# Patient Record
Sex: Female | Born: 1970
Health system: Southern US, Community
[De-identification: ages and names within clinical notes are randomized; demographics above are authoritative.]

## PROBLEM LIST (undated history)

## (undated) DIAGNOSIS — I499 Cardiac arrhythmia, unspecified: Secondary | ICD-10-CM

## (undated) DIAGNOSIS — F419 Anxiety disorder, unspecified: Secondary | ICD-10-CM

## (undated) DIAGNOSIS — R002 Palpitations: Secondary | ICD-10-CM

## (undated) DIAGNOSIS — Z87442 Personal history of urinary calculi: Secondary | ICD-10-CM

## (undated) DIAGNOSIS — R7303 Prediabetes: Secondary | ICD-10-CM

## (undated) DIAGNOSIS — G4730 Sleep apnea, unspecified: Secondary | ICD-10-CM

## (undated) DIAGNOSIS — K589 Irritable bowel syndrome without diarrhea: Secondary | ICD-10-CM

## (undated) DIAGNOSIS — R600 Localized edema: Secondary | ICD-10-CM

## (undated) DIAGNOSIS — M549 Dorsalgia, unspecified: Secondary | ICD-10-CM

## (undated) DIAGNOSIS — I10 Essential (primary) hypertension: Secondary | ICD-10-CM

## (undated) DIAGNOSIS — R0602 Shortness of breath: Secondary | ICD-10-CM

## (undated) DIAGNOSIS — K219 Gastro-esophageal reflux disease without esophagitis: Secondary | ICD-10-CM

## (undated) DIAGNOSIS — F32A Depression, unspecified: Secondary | ICD-10-CM

## (undated) HISTORY — DX: Dorsalgia, unspecified: M54.9

## (undated) HISTORY — DX: Essential (primary) hypertension: I10

## (undated) HISTORY — DX: Depression, unspecified: F32.A

## (undated) HISTORY — DX: Anxiety disorder, unspecified: F41.9

## (undated) HISTORY — PX: NO PAST SURGERIES: SHX2092

## (undated) HISTORY — DX: Sleep apnea, unspecified: G47.30

## (undated) HISTORY — DX: Shortness of breath: R06.02

## (undated) HISTORY — DX: Localized edema: R60.0

## (undated) HISTORY — DX: Gastro-esophageal reflux disease without esophagitis: K21.9

## (undated) HISTORY — DX: Palpitations: R00.2

## (undated) HISTORY — DX: Irritable bowel syndrome, unspecified: K58.9

---

## 2003-07-22 ENCOUNTER — Ambulatory Visit (HOSPITAL_COMMUNITY): Admission: RE | Admit: 2003-07-22 | Discharge: 2003-07-22 | Payer: Self-pay | Admitting: Family Medicine

## 2005-06-18 ENCOUNTER — Ambulatory Visit (HOSPITAL_COMMUNITY): Admission: RE | Admit: 2005-06-18 | Discharge: 2005-06-18 | Payer: Self-pay | Admitting: Family Medicine

## 2012-04-29 ENCOUNTER — Other Ambulatory Visit: Payer: Self-pay | Admitting: Family Medicine

## 2012-04-29 DIAGNOSIS — R109 Unspecified abdominal pain: Secondary | ICD-10-CM

## 2012-04-30 ENCOUNTER — Other Ambulatory Visit: Payer: Self-pay | Admitting: Family Medicine

## 2012-04-30 ENCOUNTER — Ambulatory Visit (HOSPITAL_COMMUNITY)
Admission: RE | Admit: 2012-04-30 | Discharge: 2012-04-30 | Disposition: A | Discharge status: Home / Self Care | Payer: BC Managed Care – PPO | Source: Ambulatory Visit | Attending: Family Medicine | Admitting: Family Medicine

## 2012-04-30 DIAGNOSIS — R109 Unspecified abdominal pain: Secondary | ICD-10-CM

## 2013-01-05 ENCOUNTER — Other Ambulatory Visit: Payer: Self-pay | Admitting: Nurse Practitioner

## 2013-01-22 ENCOUNTER — Ambulatory Visit (INDEPENDENT_AMBULATORY_CARE_PROVIDER_SITE_OTHER): Payer: BC Managed Care – PPO | Admitting: Family Medicine

## 2013-01-22 ENCOUNTER — Encounter: Payer: Self-pay | Admitting: Family Medicine

## 2013-01-22 VITALS — BP 110/80 | Temp 98.9°F | Wt 172.0 lb

## 2013-01-22 DIAGNOSIS — J029 Acute pharyngitis, unspecified: Secondary | ICD-10-CM

## 2013-01-22 MED ORDER — AZITHROMYCIN 250 MG PO TABS: ORAL_TABLET | ORAL | Status: DC

## 2013-01-22 NOTE — Progress Notes (Signed)
  Subjective:    Patient ID: Jillian Ford, female    DOB: [birth year], 42 y.o.   MRN: [id number]  Otalgia  There is pain in the right ear. The current episode started in the past 7 days. Associated symptoms include a sore throat. Associated symptoms comments: Runny nose.      Review of Systems  HENT: Positive for ear pain and sore throat.        Objective:   Physical Exam Lungs are clear heart is regular throat erythematous the eardrums are normal neck no masses       Assessment & Plan:  Otalgia-referred pain from the throat Pharyngitis-Z-Pak as directed followup if ongoing troubles

## 2013-01-23 ENCOUNTER — Encounter: Payer: Self-pay | Admitting: *Deleted

## 2013-03-05 ENCOUNTER — Encounter: Payer: Self-pay | Admitting: Nurse Practitioner

## 2013-03-05 ENCOUNTER — Other Ambulatory Visit: Payer: Self-pay | Admitting: Nurse Practitioner

## 2013-03-05 ENCOUNTER — Ambulatory Visit (INDEPENDENT_AMBULATORY_CARE_PROVIDER_SITE_OTHER): Payer: BC Managed Care – PPO | Admitting: Nurse Practitioner

## 2013-03-05 VITALS — BP 130/90 | Ht 64.0 in | Wt 176.0 lb

## 2013-03-05 DIAGNOSIS — B373 Candidiasis of vulva and vagina: Secondary | ICD-10-CM

## 2013-03-05 DIAGNOSIS — K2970 Gastritis, unspecified, without bleeding: Secondary | ICD-10-CM | POA: Insufficient documentation

## 2013-03-05 DIAGNOSIS — Z0000 Encounter for general adult medical examination without abnormal findings: Secondary | ICD-10-CM

## 2013-03-05 DIAGNOSIS — Z01419 Encounter for gynecological examination (general) (routine) without abnormal findings: Secondary | ICD-10-CM

## 2013-03-05 LAB — POCT WET PREP WITH KOH
KOH Prep POC: NEGATIVE
RBC Wet Prep HPF POC: NEGATIVE
Trichomonas, UA: NEGATIVE
Yeast Wet Prep HPF POC: POSITIVE
pH: 4.5

## 2013-03-05 MED ORDER — PANTOPRAZOLE SODIUM 40 MG PO TBEC: 40.0000 mg | DELAYED_RELEASE_TABLET | Freq: Every day | ORAL | Status: DC

## 2013-03-05 MED ORDER — FLUCONAZOLE 150 MG PO TABS: ORAL_TABLET | ORAL | Status: DC

## 2013-03-05 MED ORDER — ESCITALOPRAM OXALATE 10 MG PO TABS: 10.0000 mg | ORAL_TABLET | Freq: Every day | ORAL | Status: DC

## 2013-03-05 NOTE — Progress Notes (Signed)
Subjective:    Patient ID: Jillian Ford, female    DOB: [birth year], 42 y.o.   MRN: [id number]  HPI Presents for wellness checkup. Same sexual partner. Regular cycles, normal flow. Slight vaginal itching and burning x 5 days. Slight discharge. Rare abdominal pain. Now on Zantac, not taking qd. Needs dental exam, has a broken tooth. Some early morning awakenings.  Some anxiety that she relates to work. Regular eye exams. Drinks caffeine, nonsmoker, rare NSAID and occasional social ETOH.   Review of Systems  Constitutional: Positive for appetite change and fatigue. Negative for fever and activity change.  HENT: Positive for dental problem. Negative for hearing loss, ear pain, congestion, sore throat and rhinorrhea.   Eyes: Negative for visual disturbance.  Respiratory: Negative for cough, chest tightness, shortness of breath and wheezing.   Cardiovascular: Negative for chest pain, palpitations and leg swelling.  Gastrointestinal: Positive for abdominal pain. Negative for nausea, vomiting, diarrhea, constipation, anal bleeding and rectal pain.  Genitourinary: Positive for vaginal discharge. Negative for dysuria, urgency, frequency, enuresis, difficulty urinating, genital sores, menstrual problem, pelvic pain and dyspareunia.  Neurological: Negative for headaches.  Psychiatric/Behavioral: Positive for sleep disturbance. Negative for dysphoric mood. The patient is nervous/anxious.        Objective:   Physical Exam  Vitals reviewed. Constitutional: She is oriented to person, place, and time. She appears well-developed. No distress.  HENT:  Right Ear: External ear normal.  Left Ear: External ear normal.  Mouth/Throat: Oropharynx is clear and moist.  Neck: Normal range of motion. Neck supple. No tracheal deviation present. No thyromegaly present.  Cardiovascular: Normal rate, regular rhythm and normal heart sounds.  Exam reveals no gallop.   No murmur heard. Pulmonary/Chest: Effort normal and  breath sounds normal.  Abdominal: Soft. She exhibits no distension. There is no tenderness.  Genitourinary: Vagina normal and uterus normal. No vaginal discharge found.  Musculoskeletal: She exhibits no edema.  Lymphadenopathy:    She has no cervical adenopathy.  Neurological: She is alert and oriented to person, place, and time.  Skin: Skin is warm and dry. No rash noted.  Psychiatric: She has a normal mood and affect. Her behavior is normal.  Breast exam: No dominant masses; axillae no adenopathy EGBUS normal; vagina moderate amount white mucoid discharge Wet prep: ph 4.5, positive WBC Correction to above: mild epigastric tenderness       Assessment & Plan:  Well woman exam  Routine general medical examination at a health care facility - Plan: MM Digital Screening  Gastritis  Vaginal candidiasis - Plan: POCT Wet Prep with KOH  Meds ordered this encounter  Medications  . fluticasone (FLONASE) 50 MCG/ACT nasal spray    Sig:   . DISCONTD: pantoprazole (PROTONIX) 40 MG tablet    Sig:   . sucralfate (CARAFATE) 1 G tablet    Sig:   . pantoprazole (PROTONIX) 40 MG tablet    Sig: Take 1 tablet (40 mg total) by mouth daily. Prn acid reflux    Dispense:  30 tablet    Refill:  5    Order Specific Question:  Supervising Provider    Answer:  Merlyn Albert [[id number]]  . fluconazole (DIFLUCAN) 150 MG tablet    Sig: One po qd prn yeast infection; may repeat in 3-4 days if needed    Dispense:  2 tablet    Refill:  0    Order Specific Question:  Supervising Provider    Answer:  Merlyn Albert [[id number]]  Hold on  Zantac. Switch to Protonix.  Slowly wean off caffeine. Defers GI referral at this time. Recommend dental and vision exams. Next physical in one year.

## 2013-03-05 NOTE — Patient Instructions (Signed)
Gastroesophageal Reflux Disease, Adult  Gastroesophageal reflux disease (GERD) happens when acid from your stomach flows up into the esophagus. When acid comes in contact with the esophagus, the acid causes soreness (inflammation) in the esophagus. Over time, GERD may create small holes (ulcers) in the lining of the esophagus.  CAUSES   · Increased body weight. This puts pressure on the stomach, making acid rise from the stomach into the esophagus.  · Smoking. This increases acid production in the stomach.  · Drinking alcohol. This causes decreased pressure in the lower esophageal sphincter (valve or ring of muscle between the esophagus and stomach), allowing acid from the stomach into the esophagus.  · Late evening meals and a full stomach. This increases pressure and acid production in the stomach.  · A malformed lower esophageal sphincter.  Sometimes, no cause is found.  SYMPTOMS   · Burning pain in the lower part of the mid-chest behind the breastbone and in the mid-stomach area. This may occur twice a week or more often.  · Trouble swallowing.  · Sore throat.  · Dry cough.  · Asthma-like symptoms including chest tightness, shortness of breath, or wheezing.  DIAGNOSIS   Your caregiver may be able to diagnose GERD based on your symptoms. In some cases, X-rays and other tests may be done to check for complications or to check the condition of your stomach and esophagus.  TREATMENT   Your caregiver may recommend over-the-counter or prescription medicines to help decrease acid production. Ask your caregiver before starting or adding any new medicines.   HOME CARE INSTRUCTIONS   · Change the factors that you can control. Ask your caregiver for guidance concerning weight loss, quitting smoking, and alcohol consumption.  · Avoid foods and drinks that make your symptoms worse, such as:  · Caffeine or alcoholic drinks.  · Chocolate.  · Peppermint or mint flavorings.  · Garlic and onions.  · Spicy foods.  · Citrus fruits,  such as oranges, lemons, or limes.  · Tomato-based foods such as sauce, chili, salsa, and pizza.  · Fried and fatty foods.  · Avoid lying down for the 3 hours prior to your bedtime or prior to taking a nap.  · Eat small, frequent meals instead of large meals.  · Wear loose-fitting clothing. Do not wear anything tight around your waist that causes pressure on your stomach.  · Raise the head of your bed 6 to 8 inches with wood blocks to help you sleep. Extra pillows will not help.  · Only take over-the-counter or prescription medicines for pain, discomfort, or fever as directed by your caregiver.  · Do not take aspirin, ibuprofen, or other nonsteroidal anti-inflammatory drugs (NSAIDs).  SEEK IMMEDIATE MEDICAL CARE IF:   · You have pain in your arms, neck, jaw, teeth, or back.  · Your pain increases or changes in intensity or duration.  · You develop nausea, vomiting, or sweating (diaphoresis).  · You develop shortness of breath, or you faint.  · Your vomit is green, yellow, black, or looks like coffee grounds or blood.  · Your stool is red, bloody, or black.  These symptoms could be signs of other problems, such as heart disease, gastric bleeding, or esophageal bleeding.  MAKE SURE YOU:   · Understand these instructions.  · Will watch your condition.  · Will get help right away if you are not doing well or get worse.  Document Released: 04/11/2005 Document Revised: 09/24/2011 Document Reviewed: 01/19/2011  [employer]® Patient   Information ©2014 [employer].

## 2013-03-05 NOTE — Assessment & Plan Note (Signed)
Hold on Zantac. Switch to Protonix.  Slowly wean off caffeine. Defers GI referral at this time. Recommend dental and vision exams. Next physical in one year.

## 2013-03-09 ENCOUNTER — Ambulatory Visit (HOSPITAL_COMMUNITY)
Admission: RE | Admit: 2013-03-09 | Discharge: 2013-03-09 | Disposition: A | Discharge status: Home / Self Care | Payer: BC Managed Care – PPO | Source: Ambulatory Visit | Attending: Nurse Practitioner | Admitting: Nurse Practitioner

## 2013-03-09 DIAGNOSIS — Z1231 Encounter for screening mammogram for malignant neoplasm of breast: Secondary | ICD-10-CM | POA: Insufficient documentation

## 2013-03-12 ENCOUNTER — Other Ambulatory Visit: Payer: Self-pay | Admitting: Nurse Practitioner

## 2013-03-12 DIAGNOSIS — R928 Other abnormal and inconclusive findings on diagnostic imaging of breast: Secondary | ICD-10-CM

## 2013-04-01 ENCOUNTER — Other Ambulatory Visit: Payer: Self-pay | Admitting: Nurse Practitioner

## 2013-04-01 ENCOUNTER — Ambulatory Visit (HOSPITAL_COMMUNITY)
Admission: RE | Admit: 2013-04-01 | Discharge: 2013-04-01 | Disposition: A | Discharge status: Home / Self Care | Payer: BC Managed Care – PPO | Source: Ambulatory Visit | Attending: Nurse Practitioner | Admitting: Nurse Practitioner

## 2013-04-01 DIAGNOSIS — R928 Other abnormal and inconclusive findings on diagnostic imaging of breast: Secondary | ICD-10-CM

## 2013-04-01 MED ORDER — ESCITALOPRAM OXALATE 10 MG PO TABS: 10.0000 mg | ORAL_TABLET | Freq: Every day | ORAL | Status: DC

## 2013-04-30 ENCOUNTER — Other Ambulatory Visit: Payer: Self-pay | Admitting: *Deleted

## 2013-04-30 MED ORDER — LEVONORGESTREL-ETHINYL ESTRAD 0.1-20 MG-MCG PO TABS: 1.0000 | ORAL_TABLET | Freq: Every day | ORAL | Status: DC

## 2013-05-04 ENCOUNTER — Other Ambulatory Visit: Payer: Self-pay | Admitting: *Deleted

## 2013-05-04 MED ORDER — LEVONORGESTREL-ETHINYL ESTRAD 0.1-20 MG-MCG PO TABS: 1.0000 | ORAL_TABLET | Freq: Every day | ORAL | Status: DC

## 2013-05-21 ENCOUNTER — Other Ambulatory Visit: Payer: Self-pay

## 2013-05-28 ENCOUNTER — Encounter: Payer: Self-pay | Admitting: Family Medicine

## 2013-05-28 ENCOUNTER — Ambulatory Visit (INDEPENDENT_AMBULATORY_CARE_PROVIDER_SITE_OTHER): Payer: BC Managed Care – PPO | Admitting: Family Medicine

## 2013-05-28 VITALS — BP 132/88 | Temp 98.7°F | Ht 64.0 in | Wt 178.0 lb

## 2013-05-28 DIAGNOSIS — J019 Acute sinusitis, unspecified: Secondary | ICD-10-CM

## 2013-05-28 MED ORDER — AZITHROMYCIN 250 MG PO TABS: ORAL_TABLET | ORAL | Status: DC

## 2013-05-28 MED ORDER — HYDROCODONE-HOMATROPINE 5-1.5 MG/5ML PO SYRP: ORAL_SOLUTION | ORAL | Status: DC

## 2013-05-28 NOTE — Progress Notes (Signed)
  Subjective:    Patient ID: Jillian Ford, female    DOB: Jan 17, 1971, 42 y.o.   MRN: [id number]  Cough This is a new problem. The current episode started in the past 7 days. Associated symptoms include chest pain, headaches, nasal congestion and postnasal drip. Associated symptoms comments: sneezing. Treatments tried: mucinex D. The treatment provided mild relief.   Patient doesn't smoke PMH benign   Review of Systems  HENT: Positive for postnasal drip.   Respiratory: Positive for cough.   Cardiovascular: Positive for chest pain.  Neurological: Positive for headaches.       Objective:   Physical Exam  Lungs clear heart trigger pulse normal extremities no edema skin warm dry      Assessment & Plan:  Mild breast revealed as well as sinusitis antibiotics prescribed warning signs discussed followup ongoing trouble warnings were discussed

## 2013-07-17 ENCOUNTER — Encounter: Payer: Self-pay | Admitting: Family Medicine

## 2013-07-18 ENCOUNTER — Other Ambulatory Visit: Payer: Self-pay | Admitting: Family Medicine

## 2013-07-18 MED ORDER — ACYCLOVIR 400 MG PO TABS: 400.0000 mg | ORAL_TABLET | Freq: Two times a day (BID) | ORAL | Status: DC

## 2013-07-21 ENCOUNTER — Encounter: Payer: Self-pay | Admitting: Family Medicine

## 2013-07-21 ENCOUNTER — Ambulatory Visit (INDEPENDENT_AMBULATORY_CARE_PROVIDER_SITE_OTHER): Payer: BC Managed Care – PPO | Admitting: Family Medicine

## 2013-07-21 VITALS — BP 120/78 | Temp 99.0°F | Ht 64.0 in | Wt 180.8 lb

## 2013-07-21 DIAGNOSIS — R21 Rash and other nonspecific skin eruption: Secondary | ICD-10-CM

## 2013-07-21 DIAGNOSIS — J029 Acute pharyngitis, unspecified: Secondary | ICD-10-CM

## 2013-07-21 NOTE — Progress Notes (Signed)
   Subjective:    Patient ID: Jillian Ford, female    DOB: 1971/06/22, 43 y.o.   MRN: [id number]  HPI Patient arrives with a sore throat and white sore patches in mouth and on tongue since this am. Results for orders placed in visit on 03/05/13  POCT WET PREP WITH KOH      Result Value Range   Trichomonas, UA Negative     Clue Cells Wet Prep HPF POC rare     Epithelial Wet Prep HPF POC occas     Yeast Wet Prep HPF POC pos     Bacteria Wet Prep HPF POC rare     RBC Wet Prep HPF POC neg     WBC Wet Prep HPF POC multiple     KOH Prep POC Negative     pH 4.5     Results for orders placed in visit on 03/05/13  POCT WET PREP WITH KOH      Result Value Range   Trichomonas, UA Negative     Clue Cells Wet Prep HPF POC rare     Epithelial Wet Prep HPF POC occas     Yeast Wet Prep HPF POC pos     Bacteria Wet Prep HPF POC rare     RBC Wet Prep HPF POC neg     WBC Wet Prep HPF POC multiple     KOH Prep POC Negative     pH 4.5     Rapid strep screen negative. Recent cold sore. See telephone message. We called in Zovirax. Patient compliant with Very sore throat Had a bit of cough that lingers a month or more  Review of Systems No headache no chest pain slight cough intermittent rare no vomiting no diarrhea ROS otherwise negative    Objective:   Physical Exam  Alert slight malaise. Temp 99 hydration good HET moderate his congestion. Pharynx slight erythema neck supple tender anterior nodes herpes eruption on lip. Lungs clear heart rare rhythm.      Assessment & Plan:  Impression viral syndrome. #2 cold sore. #3 waning cough. Plan symptomatic care only. No further antibiotics. Expect gradual resolution. WSL

## 2013-07-22 LAB — STREP A DNA PROBE: GASP: NEGATIVE

## 2013-11-19 ENCOUNTER — Other Ambulatory Visit: Payer: Self-pay | Admitting: Family Medicine

## 2014-05-06 ENCOUNTER — Other Ambulatory Visit: Payer: Self-pay | Admitting: Family Medicine

## 2014-05-10 ENCOUNTER — Other Ambulatory Visit: Payer: Self-pay | Admitting: Nurse Practitioner

## 2014-08-11 ENCOUNTER — Ambulatory Visit (INDEPENDENT_AMBULATORY_CARE_PROVIDER_SITE_OTHER): Payer: BLUE CROSS/BLUE SHIELD | Admitting: Nurse Practitioner

## 2014-08-11 ENCOUNTER — Encounter: Payer: Self-pay | Admitting: Nurse Practitioner

## 2014-08-11 VITALS — BP 122/82 | Ht 64.0 in | Wt 185.6 lb

## 2014-08-11 DIAGNOSIS — Z0000 Encounter for general adult medical examination without abnormal findings: Secondary | ICD-10-CM

## 2014-08-11 DIAGNOSIS — Z01419 Encounter for gynecological examination (general) (routine) without abnormal findings: Secondary | ICD-10-CM

## 2014-08-11 DIAGNOSIS — K2970 Gastritis, unspecified, without bleeding: Secondary | ICD-10-CM

## 2014-08-11 DIAGNOSIS — Z1231 Encounter for screening mammogram for malignant neoplasm of breast: Secondary | ICD-10-CM

## 2014-08-11 DIAGNOSIS — Z124 Encounter for screening for malignant neoplasm of cervix: Secondary | ICD-10-CM

## 2014-08-11 DIAGNOSIS — R0683 Snoring: Secondary | ICD-10-CM

## 2014-08-11 DIAGNOSIS — R5382 Chronic fatigue, unspecified: Secondary | ICD-10-CM

## 2014-08-11 LAB — BASIC METABOLIC PANEL
BUN: 9 mg/dL (ref 6–23)
CO2: 25 mEq/L (ref 19–32)
Calcium: 8.9 mg/dL (ref 8.4–10.5)
Chloride: 105 mEq/L (ref 96–112)
Creat: 0.68 mg/dL (ref 0.50–1.10)
Glucose, Bld: 79 mg/dL (ref 70–99)
POTASSIUM: 4 meq/L (ref 3.5–5.3)
SODIUM: 139 meq/L (ref 135–145)

## 2014-08-11 LAB — HEPATIC FUNCTION PANEL
ALT: 24 U/L (ref 0–35)
AST: 20 U/L (ref 0–37)
Albumin: 4.1 g/dL (ref 3.5–5.2)
Alkaline Phosphatase: 71 U/L (ref 39–117)
Bilirubin, Direct: 0.2 mg/dL (ref 0.0–0.3)
Indirect Bilirubin: 0.4 mg/dL (ref 0.2–1.2)
Total Bilirubin: 0.6 mg/dL (ref 0.2–1.2)
Total Protein: 6.9 g/dL (ref 6.0–8.3)

## 2014-08-11 LAB — LIPID PANEL
Cholesterol: 111 mg/dL (ref 0–200)
HDL: 72 mg/dL (ref >39)
LDL Cholesterol: 31 mg/dL (ref 0–99)
Total CHOL/HDL Ratio: 1.5 Ratio
Triglycerides: 38 mg/dL (ref <150)
VLDL: 8 mg/dL (ref 0–40)

## 2014-08-11 LAB — TSH: TSH: 1.547 u[IU]/mL (ref 0.350–4.500)

## 2014-08-11 MED ORDER — FLUTICASONE PROPIONATE 50 MCG/ACT NA SUSP: NASAL | Status: DC

## 2014-08-11 MED ORDER — BUPROPION HCL ER (XL) 150 MG PO TB24: 150.0000 mg | ORAL_TABLET | Freq: Every day | ORAL | Status: DC

## 2014-08-11 MED ORDER — LEVONORGESTREL-ETHINYL ESTRAD 0.1-20 MG-MCG PO TABS: 1.0000 | ORAL_TABLET | Freq: Every day | ORAL | Status: DC

## 2014-08-11 MED ORDER — PANTOPRAZOLE SODIUM 40 MG PO TBEC: 40.0000 mg | DELAYED_RELEASE_TABLET | Freq: Every day | ORAL | Status: DC

## 2014-08-11 NOTE — Progress Notes (Signed)
Subjective:    Patient ID: Jillian Ford, female    DOB: 12/24/1970, 44 y.o.   MRN: [id number]  HPI presents for her wellness exam. Regular cycles; light flow. Married, same sexual partner. Regular vision and dental exams. Reflux stable, takes reflux meds on prn basis. No regular exercise. Snoring at night time. Concerned about sleep apnea.    Review of Systems  Constitutional: Positive for fatigue. Negative for fever, activity change and appetite change.  HENT: Negative for dental problem, ear pain, sinus pressure, sore throat and trouble swallowing.   Respiratory: Negative for cough, choking, chest tightness, shortness of breath and wheezing.   Cardiovascular: Negative for chest pain.  Gastrointestinal: Positive for constipation. Negative for nausea, vomiting, abdominal pain, diarrhea, blood in stool and abdominal distention.  Genitourinary: Negative for dysuria, urgency, frequency, vaginal discharge, enuresis, difficulty urinating, genital sores, menstrual problem and pelvic pain.  Psychiatric/Behavioral: Positive for sleep disturbance.       Continued depression symptoms. Could not take Lexapro due to side effects.        Objective:   Physical Exam  Constitutional: She is oriented to person, place, and time. She appears well-developed. No distress.  HENT:  Right Ear: External ear normal.  Left Ear: External ear normal.  Mouth/Throat: Oropharynx is clear and moist.  Neck: Normal range of motion. Neck supple. No tracheal deviation present. No thyromegaly present.  Cardiovascular: Normal rate, regular rhythm and normal heart sounds.  Exam reveals no gallop.   No murmur heard. Pulmonary/Chest: Effort normal and breath sounds normal.  Abdominal: Soft. She exhibits no distension and no mass. There is tenderness. There is no rebound and no guarding.  Mild epigastric area tenderness  Genitourinary: Vagina normal and uterus normal. No vaginal discharge found.  External GU: no rashes or  lesions. Vagina: no discharge. Cervix normal in appearance; no CMT. Bimanual exam: no tenderness or obvious masses.  Musculoskeletal: She exhibits no edema.  Lymphadenopathy:    She has no cervical adenopathy.  Neurological: She is alert and oriented to person, place, and time.  Skin: Skin is warm and dry. No rash noted.  Psychiatric: She has a normal mood and affect. Her behavior is normal.  Vitals reviewed. Breast exam: slightly dense tissue; no masses; axillae no adenopathy.        Assessment & Plan:   Problem List Items Addressed This Visit      Digestive   Gastritis    Other Visit Diagnoses    Well woman exam    -  Primary    Relevant Orders    Pap IG w/ reflex to HPV when ASC-U    Lipid panel    Hepatic function panel    Basic metabolic panel    Screening for cervical cancer        Relevant Orders    Pap IG w/ reflex to HPV when ASC-U    Chronic fatigue        Relevant Orders    TSH    Vit D  25 hydroxy (rtn osteoporosis monitoring)    Visit for screening mammogram        Relevant Orders    MM DIGITAL SCREENING BILATERAL    Snoring          Patient to fill out [place] questionnaire and bring to next visit. Restart Protonix daily and call back in 2-3 weeks if no better. Stop Lexapro. Switch to Wellbutrin. DC med and call if any problems. Encouraged healthy diet, regular activity  and weight loss. Discussed potential risks associated with oc use.  Meds ordered this encounter  Medications  . levonorgestrel-ethinyl estradiol (AVIANE) 0.1-20 MG-MCG tablet    Sig: Take 1 tablet by mouth daily.    Dispense:  28 tablet    Refill:  11    Order Specific Question:  Supervising Provider    Answer:  Merlyn Albert [[id number]]  . fluticasone (FLONASE) 50 MCG/ACT nasal spray    Sig: 2 sprays each nostril daily prn congestion    Dispense:  16 g    Refill:  5    Order Specific Question:  Supervising Provider    Answer:  Merlyn Albert [[id number]]  . pantoprazole (PROTONIX) 40  MG tablet    Sig: Take 1 tablet (40 mg total) by mouth daily. Prn acid reflux    Dispense:  30 tablet    Refill:  5    Order Specific Question:  Supervising Provider    Answer:  Merlyn Albert [[id number]]  . buPROPion (WELLBUTRIN XL) 150 MG 24 hr tablet    Sig: Take 1 tablet (150 mg total) by mouth daily.    Dispense:  30 tablet    Refill:  2    Order Specific Question:  Supervising Provider    Answer:  Merlyn Albert [[id number]]  . acyclovir (ZOVIRAX) 400 MG tablet    Sig:     Refill:  1   Return in about 1 month (around 09/11/2014) for med recheck. Next PE in one year.

## 2014-08-12 ENCOUNTER — Encounter: Payer: Self-pay | Admitting: Nurse Practitioner

## 2014-08-12 LAB — PAP IG W/ RFLX HPV ASCU

## 2014-08-12 LAB — VITAMIN D 25 HYDROXY (VIT D DEFICIENCY, FRACTURES): VIT D 25 HYDROXY: 35 ng/mL (ref 30–100)

## 2014-08-16 NOTE — Progress Notes (Signed)
Card sent 

## 2014-08-19 ENCOUNTER — Ambulatory Visit (HOSPITAL_COMMUNITY)
Admission: RE | Admit: 2014-08-19 | Discharge: 2014-08-19 | Disposition: A | Discharge status: Home / Self Care | Payer: BLUE CROSS/BLUE SHIELD | Source: Ambulatory Visit | Attending: Nurse Practitioner | Admitting: Nurse Practitioner

## 2014-08-19 DIAGNOSIS — Z1231 Encounter for screening mammogram for malignant neoplasm of breast: Secondary | ICD-10-CM | POA: Insufficient documentation

## 2014-08-23 ENCOUNTER — Ambulatory Visit (INDEPENDENT_AMBULATORY_CARE_PROVIDER_SITE_OTHER): Payer: BLUE CROSS/BLUE SHIELD | Admitting: Nurse Practitioner

## 2014-08-23 ENCOUNTER — Encounter: Payer: Self-pay | Admitting: Nurse Practitioner

## 2014-08-23 VITALS — BP 130/86 | Temp 99.2°F | Ht 64.0 in | Wt 187.0 lb

## 2014-08-23 DIAGNOSIS — T7840XA Allergy, unspecified, initial encounter: Secondary | ICD-10-CM

## 2014-08-23 DIAGNOSIS — Z889 Allergy status to unspecified drugs, medicaments and biological substances status: Secondary | ICD-10-CM

## 2014-08-23 MED ORDER — PREDNISONE 20 MG PO TABS: ORAL_TABLET | ORAL | Status: DC

## 2014-08-23 NOTE — Patient Instructions (Signed)
Loratadine 10 mg in the morning Benadryl 25 mg at night 

## 2014-08-23 NOTE — Progress Notes (Signed)
Subjective:  Presents for c/o possible allergic reaction to wellbutrin. Started new med on 2/4 and began having rash and itching about 48 hours later. Took a total of 3 doses. Pharmacist advised her to stop med until she could consult with us. No fever. No wheezing or difficulty swallowing. No known allergens.  Objective:   BP 130/86 mmHg  Temp(Src) 99.2 F (37.3 C) (Oral)  Ht 5\' 4"  (1.626 m)  Wt 187 lb (84.823 kg)  BMI 32.08 kg/m2  LMP 08/04/2014 NAD. Alert, oriented. Lungs clear. Heart RRR. Diffuse dark pink fine maculopapular rash over body especially on abdomen.   Assessment: Allergic reaction caused by a drug  Plan:  Meds ordered this encounter  Medications  . predniSONE (DELTASONE) 20 MG tablet    Sig: 3 po qd x 3 d then 2 po qd x 3 d then 1 po qd x 3 d    Dispense:  18 tablet    Refill:  0    Order Specific Question:  Supervising Provider    Answer:  Merlyn AlbertLUKING, WILLIAM S [[id number]]   Stop Wellbutrin. OTC antihistamines as directed. Call back in 72 hours if no improvement, sooner if worse. Reviewed warning signs.

## 2014-09-10 ENCOUNTER — Encounter: Payer: Self-pay | Admitting: Nurse Practitioner

## 2014-09-10 ENCOUNTER — Ambulatory Visit (INDEPENDENT_AMBULATORY_CARE_PROVIDER_SITE_OTHER): Payer: BLUE CROSS/BLUE SHIELD | Admitting: Nurse Practitioner

## 2014-09-10 ENCOUNTER — Ambulatory Visit (HOSPITAL_COMMUNITY)
Admission: RE | Admit: 2014-09-10 | Discharge: 2014-09-10 | Disposition: A | Discharge status: Home / Self Care | Payer: BLUE CROSS/BLUE SHIELD | Source: Ambulatory Visit | Attending: Nurse Practitioner | Admitting: Nurse Practitioner

## 2014-09-10 VITALS — BP 132/86 | Ht 64.0 in | Wt 187.2 lb

## 2014-09-10 DIAGNOSIS — R0683 Snoring: Secondary | ICD-10-CM | POA: Diagnosis not present

## 2014-09-10 DIAGNOSIS — R5383 Other fatigue: Secondary | ICD-10-CM | POA: Diagnosis not present

## 2014-09-10 DIAGNOSIS — M25561 Pain in right knee: Secondary | ICD-10-CM | POA: Insufficient documentation

## 2014-09-10 DIAGNOSIS — M65852 Other synovitis and tenosynovitis, left thigh: Secondary | ICD-10-CM | POA: Diagnosis not present

## 2014-09-10 DIAGNOSIS — M76892 Other specified enthesopathies of left lower limb, excluding foot: Secondary | ICD-10-CM

## 2014-09-10 MED ORDER — NABUMETONE 750 MG PO TABS: 750.0000 mg | ORAL_TABLET | Freq: Every day | ORAL | Status: DC

## 2014-09-14 ENCOUNTER — Encounter: Payer: Self-pay | Admitting: Nurse Practitioner

## 2014-09-14 NOTE — Progress Notes (Signed)
Subjective:  Presents to review her questionnaire to assess for OSA. Her [place] questionnaire shows very high scores indicating need for further testing. Also complaints of right knee pain for the past several months. No specific history of injury. After this began patient has also had a flareup of left hip tenderness which she had several years ago. No back pain.  Objective:   BP 132/86 mmHg  Ht 5\' 4"  (1.626 m)  Wt 187 lb 3.2 oz (84.913 kg)  BMI 32.12 kg/m2  LMP 08/04/2014 NAD. Alert, oriented. Lungs clear. Heart regular rate rhythm. Good passive ROM of the left hip without tenderness. Tenderness with palpation of the left hip joint. Both knees have significant crepitus but much worse in the right knee. No joint laxity. Mild tenderness to palpation of the anterior joint area.  Assessment: Snoring  Other fatigue  Right knee pain - Plan: DG Knee Complete 4 Views Right  Tendinitis of left hip  Plan:  Meds ordered this encounter  Medications  . nabumetone (RELAFEN) 750 MG tablet    Sig: Take 1 tablet (750 mg total) by mouth daily. Prn pain    Dispense:  30 tablet    Refill:  0    Order Specific Question:  Supervising Provider    Answer:  Merlyn AlbertLUKING, WILLIAM S [[id number]]   Schedule home-based sleep study if insurance will cover this. X-ray pending. Based on history, feel that right knee pain caused change in her gait which flared up the tendinitis in her left hip. Relafen as directed. Ice/heat applications. Light knee brace or Ace wrap. Call back if symptoms worsen or persist.

## 2014-10-05 ENCOUNTER — Other Ambulatory Visit (HOSPITAL_COMMUNITY): Payer: Self-pay | Admitting: Radiology

## 2014-10-05 DIAGNOSIS — R0683 Snoring: Secondary | ICD-10-CM

## 2014-10-05 DIAGNOSIS — G4730 Sleep apnea, unspecified: Secondary | ICD-10-CM

## 2014-10-19 ENCOUNTER — Other Ambulatory Visit: Payer: Self-pay | Admitting: Nurse Practitioner

## 2014-10-19 NOTE — Telephone Encounter (Signed)
Last seen 2.26.16.

## 2014-10-24 ENCOUNTER — Ambulatory Visit: Payer: BLUE CROSS/BLUE SHIELD | Attending: Family Medicine | Admitting: Sleep Medicine

## 2014-10-24 DIAGNOSIS — G4730 Sleep apnea, unspecified: Secondary | ICD-10-CM | POA: Insufficient documentation

## 2014-10-24 DIAGNOSIS — R0683 Snoring: Secondary | ICD-10-CM

## 2014-10-30 NOTE — Sleep Study (Signed)
  [facility] Jillian Tramble A. Gerilyn Pilgrimoonquah, MD     [email or web address]        NOCTURNAL POLYSOMNOGRAM    LOCATION: SLEEP LAB FACILITY: [facility]   PHYSICIAN: Zynasia Burklow A. Gerilyn Ford, M.D.   DATE OF STUDY: 10/24/2014.   REFERRING PHYSICIAN: Lilyan PuntScott Luking.   INDICATIONS: The patient is a 44 year old female who presents with hypersomnia, fatigue and snoring.  MEDICATIONS:  Prior to Admission medications   Medication Sig Start Date End Date Taking? Authorizing Provider  acyclovir (ZOVIRAX) 400 MG tablet  05/24/14   Historical Provider, MD  fluticasone (FLONASE) 50 MCG/ACT nasal spray 2 sprays each nostril daily prn congestion 08/11/14   Campbell Richesarolyn C Hoskins, NP  levonorgestrel-ethinyl estradiol (AVIANE) 0.1-20 MG-MCG tablet Take 1 tablet by mouth daily. 08/11/14   Campbell Richesarolyn C Hoskins, NP  nabumetone (RELAFEN) 750 MG tablet TAKE ONE TABLET BY MOUTH ONCE DAILY AS NEEDED FOR PAIN 10/19/14   Babs SciaraScott A Luking, MD  pantoprazole (PROTONIX) 40 MG tablet Take 1 tablet (40 mg total) by mouth daily. Prn acid reflux 08/11/14   Campbell Richesarolyn C Hoskins, NP  Ranitidine HCl (ZANTAC PO) Take by mouth.    Historical Provider, MD      EPWORTH SLEEPINESS SCALE: 13.   BMI: 32.   ARCHITECTURAL SUMMARY: Total recording time was 412 minutes. Sleep efficiency 80 %. Sleep latency 56 minutes. REM latency 77 minutes. Stage NI 6 %, N2 66 % and N3 7 % and REM sleep 21 %.    RESPIRATORY DATA:  Baseline oxygen saturation is 97 %. The lowest saturation is 82 %. The diagnostic AHI is 15. The RDI is 16. The REM AHI is 29.  LIMB MOVEMENT SUMMARY: PLM index 2.   ELECTROCARDIOGRAM SUMMARY: Average heart rate is 80 with no significant dysrhythmias observed.   IMPRESSION:  1. Moderate obstructive sleep apnea syndrome worse during REM sleep. Formal CPAP titration study is recommended.  Thanks for this referral.  Arch Methot A. Gerilyn Ford, M.D. Diplomat, Biomedical engineerAmerican Board of Sleep Medicine.

## 2014-11-01 ENCOUNTER — Encounter: Payer: Self-pay | Admitting: Nurse Practitioner

## 2014-11-02 ENCOUNTER — Other Ambulatory Visit: Payer: Self-pay | Admitting: Nurse Practitioner

## 2014-11-02 MED ORDER — ESCITALOPRAM OXALATE 10 MG PO TABS: 10.0000 mg | ORAL_TABLET | Freq: Every day | ORAL | Status: DC

## 2014-11-24 ENCOUNTER — Other Ambulatory Visit: Payer: Self-pay | Admitting: *Deleted

## 2014-11-24 DIAGNOSIS — G4730 Sleep apnea, unspecified: Secondary | ICD-10-CM

## 2014-12-20 ENCOUNTER — Encounter: Payer: Self-pay | Admitting: Nurse Practitioner

## 2014-12-20 ENCOUNTER — Ambulatory Visit (INDEPENDENT_AMBULATORY_CARE_PROVIDER_SITE_OTHER): Payer: BLUE CROSS/BLUE SHIELD | Admitting: Nurse Practitioner

## 2014-12-20 ENCOUNTER — Other Ambulatory Visit (HOSPITAL_COMMUNITY): Payer: Self-pay | Admitting: Respiratory Therapy

## 2014-12-20 VITALS — BP 142/96 | Temp 98.7°F | Wt 179.0 lb

## 2014-12-20 DIAGNOSIS — Z23 Encounter for immunization: Secondary | ICD-10-CM

## 2014-12-20 DIAGNOSIS — W57XXXA Bitten or stung by nonvenomous insect and other nonvenomous arthropods, initial encounter: Secondary | ICD-10-CM | POA: Diagnosis not present

## 2014-12-20 DIAGNOSIS — S40862A Insect bite (nonvenomous) of left upper arm, initial encounter: Secondary | ICD-10-CM | POA: Diagnosis not present

## 2014-12-20 DIAGNOSIS — G4730 Sleep apnea, unspecified: Secondary | ICD-10-CM

## 2014-12-20 MED ORDER — DOXYCYCLINE HYCLATE 100 MG PO TABS: 100.0000 mg | ORAL_TABLET | Freq: Two times a day (BID) | ORAL | Status: DC

## 2014-12-20 NOTE — Addendum Note (Signed)
Addended by: Metro KungICHARDS, Jurney Overacker M on: 12/20/2014 04:00 PM   Modules accepted: Orders

## 2014-12-20 NOTE — Progress Notes (Signed)
Subjective:  Presents for complete plaints of a possible bite on the left upper outer arm. First noticed 2 days ago. Had a blister on the area which ruptured this morning. No fever. No joint pain. No rash.  Objective:   BP 142/96 mmHg  Temp(Src) 98.7 F (37.1 C) (Oral)  Wt 179 lb (81.194 kg) NAD. Alert, oriented. Slightly raised pink area noted on the left upper lateral arm. In the center is a 3-4 mm superficial open area with no drainage. Minimally tender to palpation.  Assessment: Insect bite of left arm, initial encounter; suspect probable spider bite  Plan:  Meds ordered this encounter  Medications  . doxycycline (VIBRA-TABS) 100 MG tablet    Sig: Take 1 tablet (100 mg total) by mouth 2 (two) times daily.    Dispense:  14 tablet    Refill:  0    Order Specific Question:  Supervising Provider    Answer:  Merlyn AlbertLUKING, WILLIAM S [[id number]]   Reviewed warning signs including signs of infection and signs of brown recluse bite. Start doxycycline if needed. Warm compresses. Call back by the end of the week if no improvement, sooner if worse. Also recommend tetanus up-to-date either in our office or at the pharmacy.

## 2014-12-31 ENCOUNTER — Other Ambulatory Visit (HOSPITAL_COMMUNITY): Payer: Self-pay | Admitting: Respiratory Therapy

## 2015-01-03 ENCOUNTER — Ambulatory Visit: Payer: BLUE CROSS/BLUE SHIELD | Attending: Family Medicine | Admitting: Sleep Medicine

## 2015-01-03 DIAGNOSIS — G4730 Sleep apnea, unspecified: Secondary | ICD-10-CM | POA: Insufficient documentation

## 2015-01-08 NOTE — Sleep Study (Signed)
  [facility] Anie Juniel A. Gerilyn Pilgrim, MD     [email or web address]        NOCTURNAL POLYSOMNOGRAM    LOCATION: SLEEP LAB FACILITY: [place]   PHYSICIAN: Liberti Appleton A. Gerilyn Pilgrim, M.D.   DATE OF STUDY: 01/03/2015.   REFERRING PHYSICIAN: Lilyan Punt.   INDICATIONS: Patient is a 44 year old who had a previous study documenting significant obstructive sleep apnea syndrome. This is a CPAP titration recording.  MEDICATIONS:  Prior to Admission medications   Medication Sig Start Date End Date Taking? Authorizing Provider  acyclovir (ZOVIRAX) 400 MG tablet  05/24/14   Historical Provider, MD  doxycycline (VIBRA-TABS) 100 MG tablet Take 1 tablet (100 mg total) by mouth 2 (two) times daily. 12/20/14   Campbell Riches, NP  escitalopram (LEXAPRO) 10 MG tablet Take 1 tablet (10 mg total) by mouth daily. 11/02/14 11/02/15  Campbell Riches, NP  fluticasone Aleda Grana) 50 MCG/ACT nasal spray 2 sprays each nostril daily prn congestion 08/11/14   Campbell Riches, NP  levonorgestrel-ethinyl estradiol (AVIANE) 0.1-20 MG-MCG tablet Take 1 tablet by mouth daily. 08/11/14   Campbell Riches, NP  nabumetone (RELAFEN) 750 MG tablet TAKE ONE TABLET BY MOUTH ONCE DAILY AS NEEDED FOR PAIN 10/19/14   Babs Sciara, MD  pantoprazole (PROTONIX) 40 MG tablet Take 1 tablet (40 mg total) by mouth daily. Prn acid reflux 08/11/14   Campbell Riches, NP  Ranitidine HCl (ZANTAC PO) Take by mouth.    Historical Provider, MD      EPWORTH SLEEPINESS SCALE: 15.   BMI: 32.   ARCHITECTURAL SUMMARY: Total recording time was 436 minutes. Sleep efficiency 87 %. Sleep latency 18 minutes. REM latency 74 minutes. Stage NI 3 %, N2 53 % and N3 14 % and REM sleep 30 %.    RESPIRATORY DATA:  Baseline oxygen saturation is 98 %. The lowest saturation is 90 %. The patient was placed on positive pressure starting at 5 and increased to 8. The optimal pressure is 8 with resolution of obstructive events and good tolerance.  LIMB MOVEMENT  SUMMARY: PLM index 0.   ELECTROCARDIOGRAM SUMMARY: Average heart rate is 72 with no significant dysrhythmias observed.   IMPRESSION:  1. Obstructive sleep apnea syndrome which responds well to a CPAP of 8.  Thanks for this referral.  Jamaurion Slemmer A. Gerilyn Pilgrim, M.D. Diplomat, Biomedical engineer of Sleep Medicine.

## 2015-02-28 ENCOUNTER — Telehealth: Payer: Self-pay | Admitting: Family Medicine

## 2015-02-28 NOTE — Telephone Encounter (Signed)
Pt is wanting to know/discuss her sleep study results.

## 2015-02-28 NOTE — Telephone Encounter (Signed)
For some reason the test result was posted in the electronic records but this was not automatically brought to the attention of Jillian Ford. The tests does show sleep apnea. It does show that she does best with CPAP of 8 cm. We would recommend starting CPAP at 8 cm. The patient may get this at [facility] if the patient would like to set up office visit to discuss in detail we will be happy to do so. Otherwise start CPAP follow-up in approximately 3-4 weeks with Jillian Ford. May check with her insurance company to see if the insurance company has preferred provider.

## 2015-03-01 NOTE — Telephone Encounter (Signed)
[facility] (script for Cpap and office note faxed to [facility])

## 2015-03-01 NOTE — Telephone Encounter (Signed)
Notified patient that the tests does show sleep apnea. It does show that she does best with CPAP of 8 cm. We would recommend starting CPAP at 8 cm. The patient may get this at [facility] if the patient would like to set up office visit to discuss in detail we will be happy to do so. Otherwise start CPAP follow-up in approximately 3-4 weeks with Eber Jones. May check with her insurance company to see if the insurance company has preferred provider. Patient verbalized understanding. Order faxed to [facility].

## 2015-03-09 ENCOUNTER — Telehealth: Payer: Self-pay | Admitting: Family Medicine

## 2015-03-09 NOTE — Telephone Encounter (Signed)
Pt states the first night she used cpap she had really bad gas, the second night she woke up with bad stomach pains. She has not used since then and has not had any issues with her stomach. Please advise

## 2015-03-09 NOTE — Telephone Encounter (Signed)
Pt called stating that she needs to speak to a nurse regarding her cpap machine. Pt states that she is so bloated in the mornings after using it that she is miserable.

## 2015-03-10 NOTE — Telephone Encounter (Signed)
Please find out from the patient where she got her CPAP machine. Possibly the settings can be adjusted to gradually increase the pressure up to the stated amount. If we do that correction and she continues to have ongoing issues the next step would be we would refer her to a pulmonary sleep specialist who may try other measures. The first thing we would need to know his where she got her machine so we can talk with the supplier

## 2015-03-10 NOTE — Telephone Encounter (Signed)
Spoke with patient and asked her per Dr.Scott who supplier was for the CPAP machine. Patient stated that it was [place] and she also stated that she wore CPAP for 6 hours last night and had fewer symptoms. I called [employer] and asked if the pressure could be adjusted up to the stated amount per Dr.Scott and was informed that we would need a new prescription to adjust the pressure and an appointment would need to be made for the patient to see the respiratory therapist.

## 2015-03-11 NOTE — Telephone Encounter (Signed)
Prescription faxed over to [facility]. Called patient and informed her that prescription was faxed and she should be able to call and make an appointment with the respiratory therapist at [facility] to have CPAP adjusted to the correct amount. Patient verbalized understanding.

## 2015-03-11 NOTE — Telephone Encounter (Signed)
Please write out on a prescription for the patient to have CPAP machine with gradual titration to her stated amount. I will sign it then fax it to [facility] and they can schedule her an appointment with respiratory therapy.

## 2015-06-03 ENCOUNTER — Other Ambulatory Visit: Payer: Self-pay | Admitting: Family Medicine

## 2015-06-03 ENCOUNTER — Telehealth: Payer: Self-pay | Admitting: Family Medicine

## 2015-06-03 NOTE — Telephone Encounter (Signed)
Called patient and informed her per Dr.Scott Luking that refill was sent in for Acyclovir. Patient verbalized understanding.

## 2015-06-03 NOTE — Telephone Encounter (Signed)
May ref prn 

## 2015-06-03 NOTE — Telephone Encounter (Signed)
Pt is needing a refill on her acyclovir sent into [facility] [place].

## 2015-07-27 ENCOUNTER — Other Ambulatory Visit: Payer: Self-pay | Admitting: Family Medicine

## 2015-08-02 ENCOUNTER — Other Ambulatory Visit: Payer: Self-pay | Admitting: *Deleted

## 2015-08-02 MED ORDER — LEVONORGESTREL-ETHINYL ESTRAD 0.1-20 MG-MCG PO TABS: 1.0000 | ORAL_TABLET | Freq: Every day | ORAL | Status: DC

## 2015-08-02 MED ORDER — ACYCLOVIR 400 MG PO TABS: 400.0000 mg | ORAL_TABLET | Freq: Two times a day (BID) | ORAL | Status: DC

## 2015-08-02 MED ORDER — FLUTICASONE PROPIONATE 50 MCG/ACT NA SUSP: NASAL | Status: DC

## 2015-08-26 ENCOUNTER — Ambulatory Visit (INDEPENDENT_AMBULATORY_CARE_PROVIDER_SITE_OTHER): Payer: BLUE CROSS/BLUE SHIELD | Admitting: Nurse Practitioner

## 2015-08-26 ENCOUNTER — Encounter: Payer: Self-pay | Admitting: Nurse Practitioner

## 2015-08-26 VITALS — BP 132/88 | Ht 64.0 in | Wt 189.0 lb

## 2015-08-26 DIAGNOSIS — F419 Anxiety disorder, unspecified: Secondary | ICD-10-CM | POA: Diagnosis not present

## 2015-08-26 DIAGNOSIS — G4700 Insomnia, unspecified: Secondary | ICD-10-CM

## 2015-08-26 DIAGNOSIS — F411 Generalized anxiety disorder: Secondary | ICD-10-CM

## 2015-08-26 DIAGNOSIS — F430 Acute stress reaction: Principal | ICD-10-CM

## 2015-08-26 MED ORDER — CLONAZEPAM 0.5 MG PO TABS: 0.5000 mg | ORAL_TABLET | Freq: Two times a day (BID) | ORAL | Status: DC | PRN

## 2015-08-26 MED ORDER — CITALOPRAM HYDROBROMIDE 20 MG PO TABS: 20.0000 mg | ORAL_TABLET | Freq: Every day | ORAL | Status: DC

## 2015-08-29 ENCOUNTER — Encounter: Payer: Self-pay | Admitting: Nurse Practitioner

## 2015-08-29 DIAGNOSIS — G4700 Insomnia, unspecified: Secondary | ICD-10-CM | POA: Insufficient documentation

## 2015-08-29 DIAGNOSIS — F419 Anxiety disorder, unspecified: Secondary | ICD-10-CM | POA: Insufficient documentation

## 2015-08-29 NOTE — Progress Notes (Signed)
Subjective:  Presents for recheck on her anxiety. Stopped her Lexapro due to side effects. Has been under extreme stress lately. Weight gain. Early morning awakenings. States her mind is racing at times. Emotional lability. Difficulty focusing. Difficulty remembering short-term. Denies suicidal or homicidal thoughts or ideation.  Objective:   BP 132/88 mmHg  Ht  (1.626 m)  Wt 189 lb (85.73 kg)  BMI 32.43 kg/m2 NAD. Alert, oriented. Thoughts logical coherent and relevant. Mildly anxious affect. Tearful during visit. Lungs clear. Heart regular rate rhythm.  Assessment:  Problem List Items Addressed This Visit      Other   Anxiety as acute reaction to exceptional stress - Primary   Relevant Medications   citalopram (CELEXA) 20 MG tablet   Insomnia     Plan:  Meds ordered this encounter  Medications  . citalopram (CELEXA) 20 MG tablet    Sig: Take 1 tablet (20 mg total) by mouth daily.    Dispense:  30 tablet    Refill:  2    Order Specific Question:  Supervising Provider    Answer:  Merlyn Albert [[id number]]  . clonazePAM (KLONOPIN) 0.5 MG tablet    Sig: Take 1 tablet (0.5 mg total) by mouth 2 (two) times daily as needed for anxiety.    Dispense:  30 tablet    Refill:  2    Order Specific Question:  Supervising Provider    Answer:  Riccardo Dubin   Start daily citalopram as directed. DC med and call if any adverse effects. Also Klonopin mainly for nighttime, may take occasionally during the day only for extreme anxiety. Return in about 3 months (around 11/23/2015) for recheck. Call back sooner if needed.

## 2015-09-22 ENCOUNTER — Other Ambulatory Visit: Payer: Self-pay | Admitting: Family Medicine

## 2015-10-19 ENCOUNTER — Other Ambulatory Visit: Payer: Self-pay | Admitting: Nurse Practitioner

## 2015-11-24 ENCOUNTER — Ambulatory Visit: Payer: BLUE CROSS/BLUE SHIELD | Admitting: Nurse Practitioner

## 2015-12-08 ENCOUNTER — Other Ambulatory Visit: Payer: Self-pay | Admitting: Family Medicine

## 2015-12-13 ENCOUNTER — Encounter: Payer: Self-pay | Admitting: Family Medicine

## 2015-12-13 ENCOUNTER — Ambulatory Visit (INDEPENDENT_AMBULATORY_CARE_PROVIDER_SITE_OTHER): Payer: BLUE CROSS/BLUE SHIELD | Admitting: Family Medicine

## 2015-12-13 VITALS — BP 134/80 | Temp 98.9°F | Ht 64.0 in | Wt 188.2 lb

## 2015-12-13 DIAGNOSIS — T148 Other injury of unspecified body region: Secondary | ICD-10-CM | POA: Diagnosis not present

## 2015-12-13 DIAGNOSIS — W57XXXA Bitten or stung by nonvenomous insect and other nonvenomous arthropods, initial encounter: Secondary | ICD-10-CM | POA: Diagnosis not present

## 2015-12-13 MED ORDER — DOXYCYCLINE HYCLATE 100 MG PO CAPS: 100.0000 mg | ORAL_CAPSULE | Freq: Two times a day (BID) | ORAL | Status: DC

## 2015-12-13 NOTE — Progress Notes (Signed)
   Subjective:    Patient ID: Jillian Ford, female    DOB: Dec 15, 1970, 45 y.o.   MRN: [id number]  HPI Patient is here today for a insect bite on her back. Redness, itchiness and drainage. Onset 2 days ago. Treatments tried: none.   Patient states that she has no other concerns at this time.  Patient states she had a bug bite it's progressively got more red slightly tender denies high fever chills denies sweats nausea or vomiting. Does not know if it was a tick bite or another insect. She noticed this a couple days ago it's been getting progressively worse denies severe headaches  Review of Systems     Objective:   Physical Exam  Lungs clear hearts regular the right posterior aspect has a reddened area approximately one half to 2 inches in length and 1 inch in diameter appears to be a localized allergic reaction with possibility cellulitis. There does appear to be a central area where a bug bite occurred.      Assessment & Plan:  Allergic reaction with possible cellulitis antibiotics prescribed hydrocortisone cream for itching follow-up if progressive troubles warning signs were discussed in detail

## 2015-12-17 ENCOUNTER — Other Ambulatory Visit: Payer: Self-pay | Admitting: Family Medicine

## 2015-12-19 ENCOUNTER — Telehealth: Payer: Self-pay | Admitting: Family Medicine

## 2015-12-19 NOTE — Telephone Encounter (Signed)
Rx sent electronically to pharmacy. Patient notified. 

## 2015-12-19 NOTE — Telephone Encounter (Signed)
FALMINA 0.1-20 MG-MCG tablet  Refill please    [facility]

## 2016-02-08 ENCOUNTER — Encounter: Payer: Self-pay | Admitting: Nurse Practitioner

## 2016-02-09 ENCOUNTER — Other Ambulatory Visit: Payer: Self-pay | Admitting: Nurse Practitioner

## 2016-02-09 MED ORDER — DULOXETINE HCL 30 MG PO CPEP: 30.0000 mg | ORAL_CAPSULE | Freq: Every day | ORAL | 2 refills | Status: DC

## 2016-02-23 IMAGING — MG MM DIGITAL SCREENING
4 series · 4 of 4 positions shown · non-contrast
Comparison: Previous exam(s).

CLINICAL DATA: Screening.

EXAM:
DIGITAL SCREENING BILATERAL MAMMOGRAM WITH CAD

[L CC]
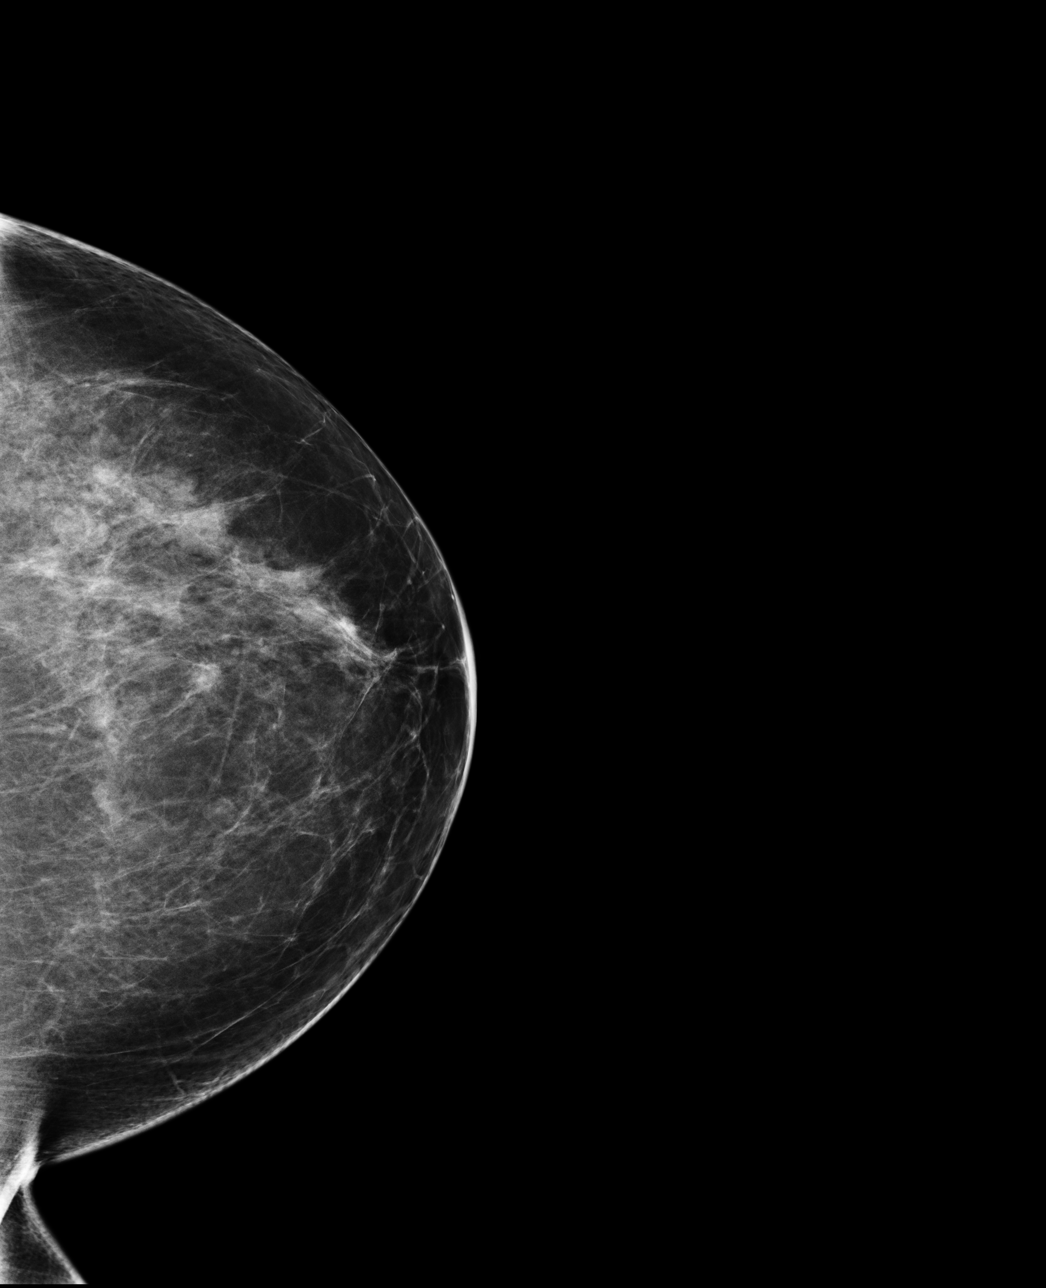

[L MLO]
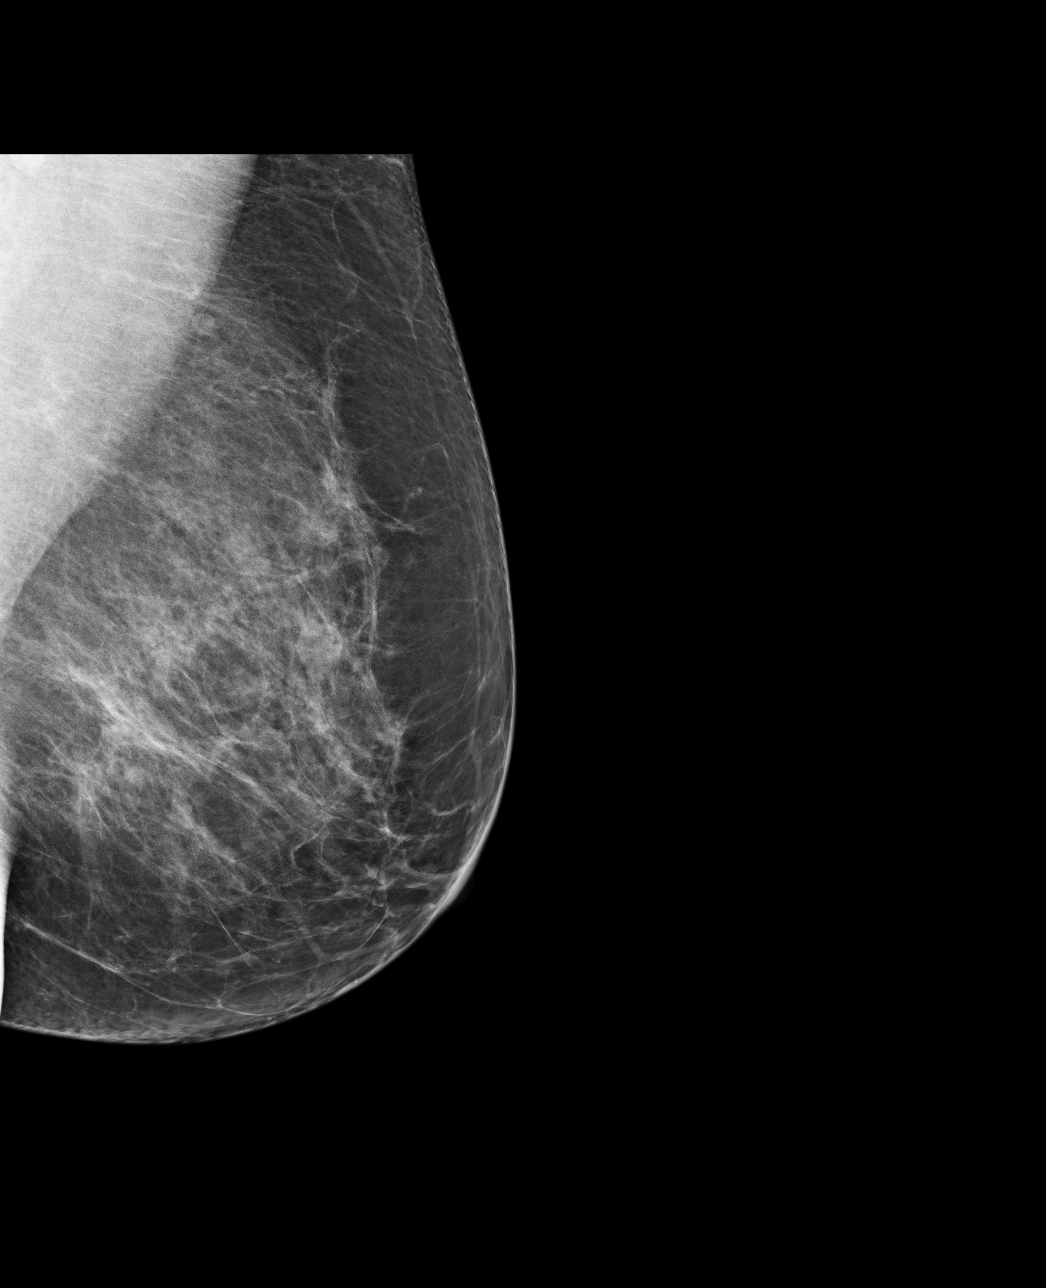

[R CC]
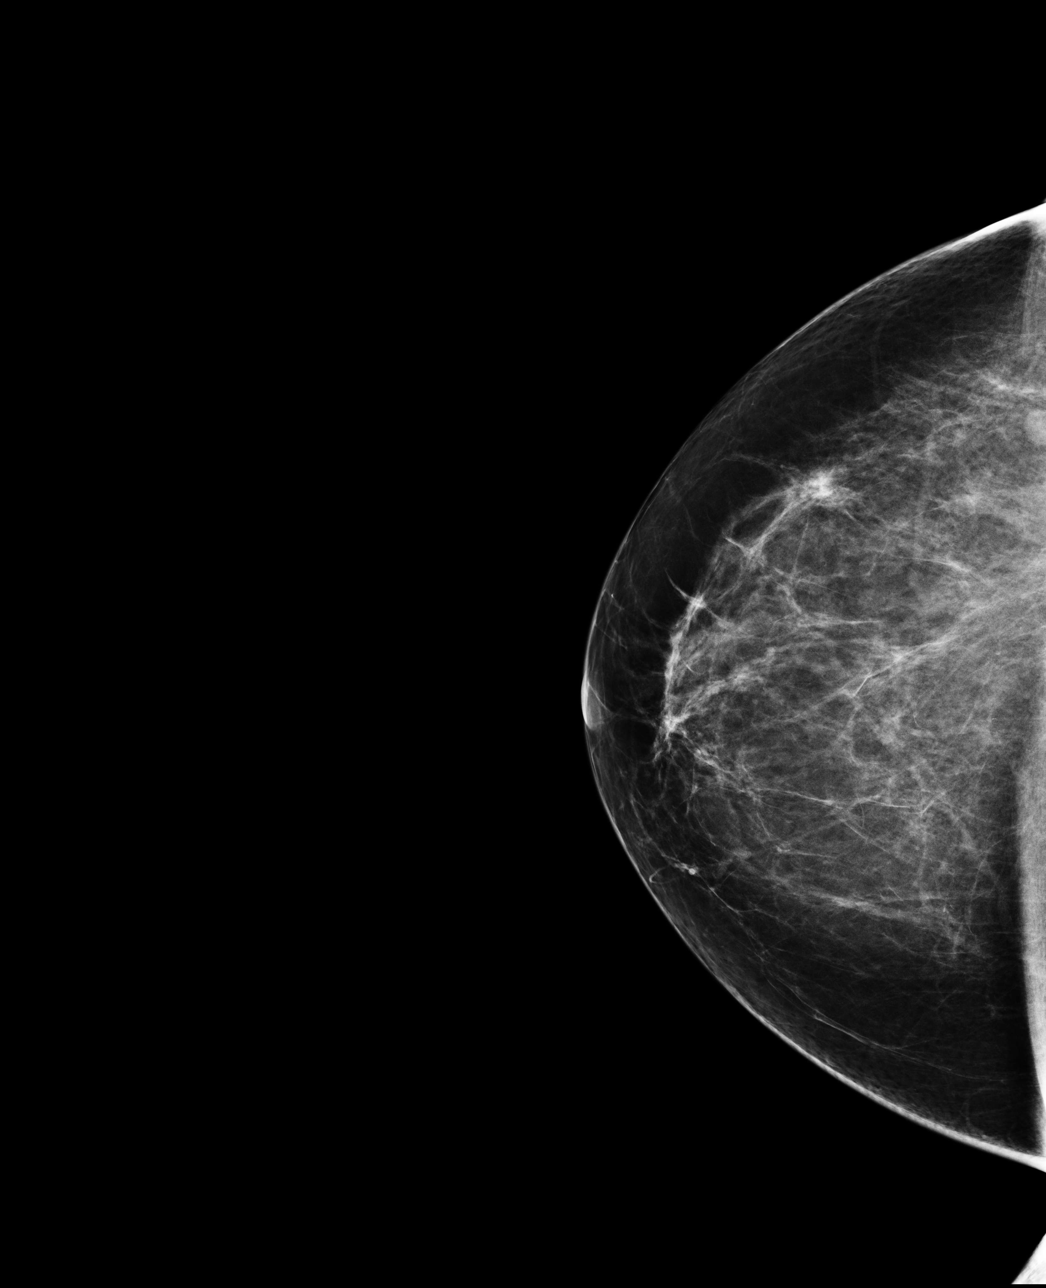

[R MLO]
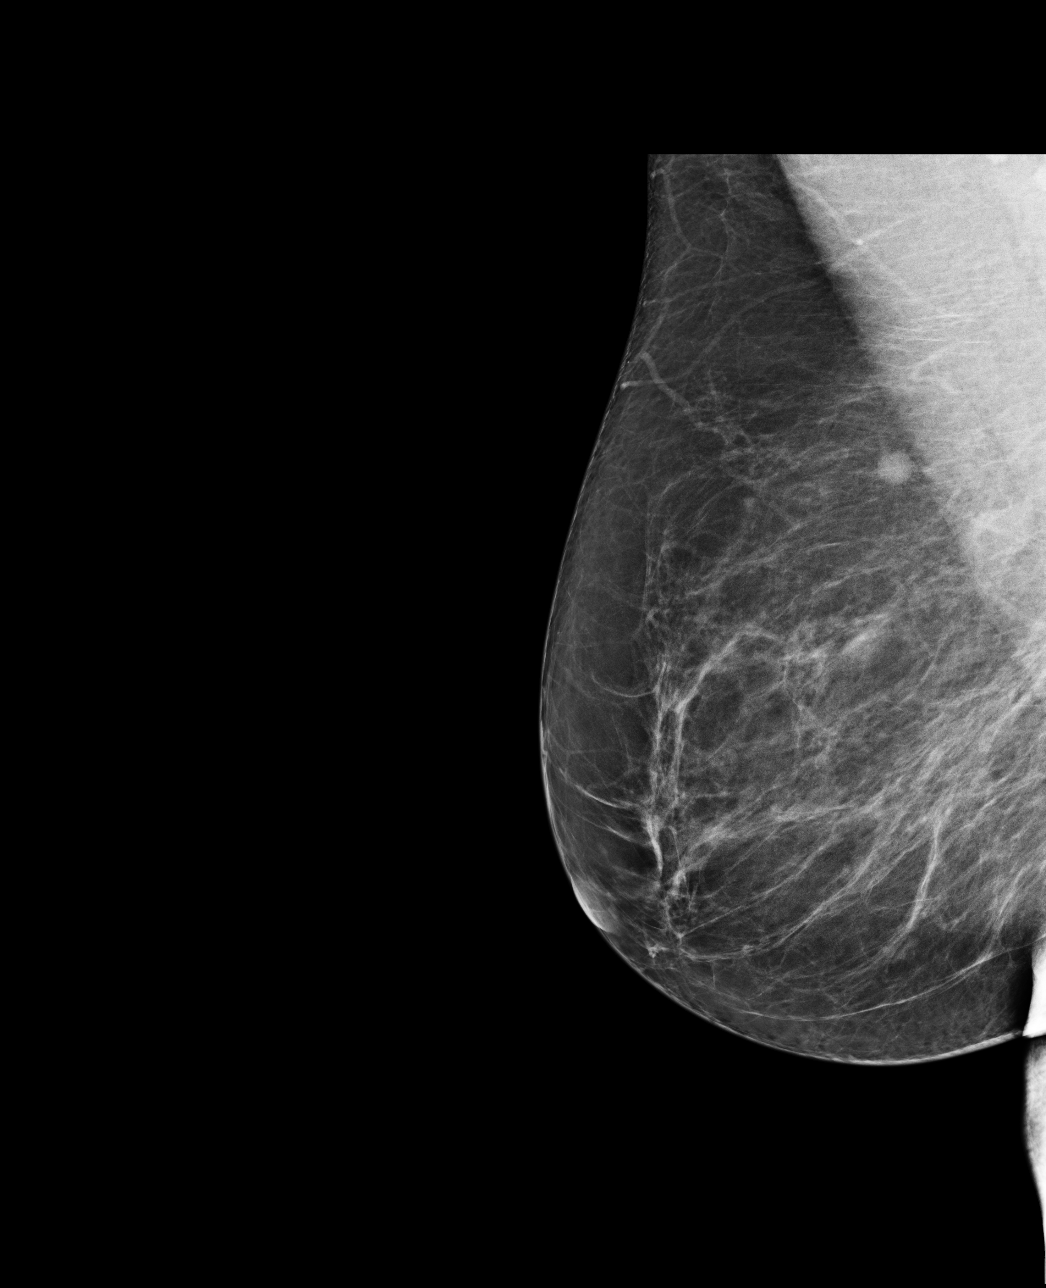

[4 of 4 positions shown; findings below may reference images not displayed]

ACR Breast Density Category b: There are scattered areas of
fibroglandular density.
FINDINGS: There are no findings suspicious for malignancy. Images were
processed with CAD.
IMPRESSION: No mammographic evidence of malignancy. A result letter of this
screening mammogram will be mailed directly to the patient.

RECOMMENDATION:
Screening mammogram in one year. (Code:AS-G-LCT)

BI-RADS CATEGORY  1: Negative.

## 2016-03-13 ENCOUNTER — Emergency Department (HOSPITAL_COMMUNITY)
Admission: EM | Admit: 2016-03-13 | Discharge: 2016-03-14 | Disposition: A | Discharge status: Home / Self Care | Payer: BLUE CROSS/BLUE SHIELD | Attending: Emergency Medicine | Admitting: Emergency Medicine

## 2016-03-13 ENCOUNTER — Encounter (HOSPITAL_COMMUNITY): Payer: Self-pay | Admitting: *Deleted

## 2016-03-13 DIAGNOSIS — R55 Syncope and collapse: Secondary | ICD-10-CM | POA: Insufficient documentation

## 2016-03-13 DIAGNOSIS — E860 Dehydration: Secondary | ICD-10-CM | POA: Diagnosis not present

## 2016-03-13 DIAGNOSIS — R197 Diarrhea, unspecified: Secondary | ICD-10-CM

## 2016-03-13 LAB — CBG MONITORING, ED: Glucose-Capillary: 125 mg/dL — ABNORMAL HIGH (ref 65–99)

## 2016-03-13 NOTE — ED Triage Notes (Signed)
Pt states she was having abdominal pain and went to the bathroom; pt states she had an episode of diarrhea and felt nauseous and then she doesn't remember what happened after that; pt's husband states he went in bathroom and found pt slumped over leaning on sink not responding to questions; pt states while she was sitting on toilet her hands felt like they were to "locking up"

## 2016-03-14 LAB — URINALYSIS, ROUTINE W REFLEX MICROSCOPIC
Bilirubin Urine: NEGATIVE
GLUCOSE, UA: NEGATIVE mg/dL
Ketones, ur: 15 mg/dL — AB
LEUKOCYTES UA: NEGATIVE
Nitrite: NEGATIVE
PH: 6 (ref 5.0–8.0)
Protein, ur: 30 mg/dL — AB
SPECIFIC GRAVITY, URINE: 1.02 (ref 1.005–1.030)

## 2016-03-14 LAB — CBC WITH DIFFERENTIAL/PLATELET
BASOS ABS: 0.1 10*3/uL (ref 0.0–0.1)
Basophils Relative: 0 %
EOS ABS: 0.2 10*3/uL (ref 0.0–0.7)
EOS PCT: 1 %
HCT: 44.6 % (ref 36.0–46.0)
Hemoglobin: 15.2 g/dL — ABNORMAL HIGH (ref 12.0–15.0)
Lymphocytes Relative: 16 %
Lymphs Abs: 2.9 10*3/uL (ref 0.7–4.0)
MCH: 31.6 pg (ref 26.0–34.0)
MCHC: 34.1 g/dL (ref 30.0–36.0)
MCV: 92.7 fL (ref 78.0–100.0)
MONO ABS: 1.2 10*3/uL — AB (ref 0.1–1.0)
Monocytes Relative: 7 %
Neutro Abs: 13.9 10*3/uL — ABNORMAL HIGH (ref 1.7–7.7)
Neutrophils Relative %: 76 %
PLATELETS: 342 10*3/uL (ref 150–400)
RBC: 4.81 MIL/uL (ref 3.87–5.11)
RDW: 12.5 % (ref 11.5–15.5)
WBC: 18.3 10*3/uL — AB (ref 4.0–10.5)

## 2016-03-14 LAB — COMPREHENSIVE METABOLIC PANEL
ALT: 24 U/L (ref 14–54)
AST: 20 U/L (ref 15–41)
Albumin: 4 g/dL (ref 3.5–5.0)
Alkaline Phosphatase: 79 U/L (ref 38–126)
Anion gap: 11 (ref 5–15)
BUN: 13 mg/dL (ref 6–20)
CHLORIDE: 101 mmol/L (ref 101–111)
CO2: 24 mmol/L (ref 22–32)
Calcium: 8.9 mg/dL (ref 8.9–10.3)
Creatinine, Ser: 0.79 mg/dL (ref 0.44–1.00)
GLUCOSE: 133 mg/dL — AB (ref 65–99)
POTASSIUM: 3.2 mmol/L — AB (ref 3.5–5.1)
SODIUM: 136 mmol/L (ref 135–145)
Total Bilirubin: 0.6 mg/dL (ref 0.3–1.2)
Total Protein: 7.4 g/dL (ref 6.5–8.1)

## 2016-03-14 LAB — URINE MICROSCOPIC-ADD ON

## 2016-03-14 LAB — LIPASE, BLOOD: Lipase: 70 U/L — ABNORMAL HIGH (ref 11–51)

## 2016-03-14 LAB — POC URINE PREG, ED: Preg Test, Ur: NEGATIVE

## 2016-03-14 LAB — TROPONIN I: Troponin I: <0.03 ng/mL (ref <0.03)

## 2016-03-14 MED ORDER — SODIUM CHLORIDE 0.9 % IV BOLUS (SEPSIS)
1000.0000 mL | Freq: Once | INTRAVENOUS | Status: AC
  Administered 2016-03-14: 1000 mL via INTRAVENOUS

## 2016-03-14 MED ORDER — DICYCLOMINE HCL 10 MG PO CAPS
ORAL_CAPSULE | ORAL | Status: AC
  Administered 2016-03-14: 02:00:00
  Filled 2016-03-14: qty 2

## 2016-03-14 MED ORDER — LOPERAMIDE HCL 2 MG PO CAPS
ORAL_CAPSULE | ORAL | Status: AC
  Administered 2016-03-14: 02:00:00
  Filled 2016-03-14: qty 2

## 2016-03-14 MED ORDER — LOPERAMIDE HCL 2 MG PO CAPS: 2.0000 mg | ORAL_CAPSULE | Freq: Four times a day (QID) | ORAL | 0 refills | Status: DC | PRN

## 2016-03-14 MED ORDER — DICYCLOMINE HCL 20 MG PO TABS: 20.0000 mg | ORAL_TABLET | Freq: Two times a day (BID) | ORAL | 0 refills | Status: DC

## 2016-03-14 MED ORDER — ONDANSETRON 4 MG PO TBDP: 4.0000 mg | ORAL_TABLET | Freq: Three times a day (TID) | ORAL | 0 refills | Status: DC | PRN

## 2016-03-14 NOTE — ED Notes (Signed)
Ambulated patient to bathroom to try and get a urine sample. Husband in bathroom with her.

## 2016-03-14 NOTE — ED Notes (Signed)
Patient drank 8oz of water with no issues.

## 2016-03-14 NOTE — ED Notes (Signed)
Patient verbalizes understanding of discharge instructions, prescriptions, home care and follow up care. Patient out of department at this time. 

## 2016-03-14 NOTE — ED Provider Notes (Signed)
[facility]-EM[facility] DEPT Provider Note   CSN: [id number] Arrival date & time: 03/13/16  2329     History   Chief Complaint Chief Complaint  Patient presents with  . Loss of Consciousness    HPI Jillian Ford is a 45 y.o. female.  Patient is a 45 year old female who presents to the emergency department because of "loss of consciousness".  The history is obtained through the patient and the patient's husband. The patient states that she went to bed tonight around on 10:06 PM. She states that her stomach was hurting. She had been at a baseball game, she had a margarita before going to the game, and she had hotdogs and chips and soda while at the ball game. She was unable to find a comfortable position in bed and she got up because she felt Nauseated and She Also Felt like She Had to Have Some Diarrhea. She Went to the Bathroom Noted Diarrhea Stool, She Called out to Her Husband That She Didn't Feel Good and She Thinks That at that  [facility] She May Have Blacked out. Her Husband States That She Was "Slumped over a Counter in the [employer]". He States That She Was Having Difficulty Responding to His Questions and She Looked Pale. The Patient States That As She Began to [facility] up She Noticed That Her Fingers Were Locked up, and She Had Tingling in Her Upper and Lower Extremities on. This Did Not Last Long. The Patient Was Brought to the Emergency Department by Private Vehicle. The Patient's Husband States That during the Trip to the Emergency Department There Were No Other Syncopal Episodes, There Was No More Vomiting, and There's Been No Other Sensations of Having to Have a Bowel Movement since That Time. It Is of Note That the Patient Has Some Issues with Esophageal Reflux and Indigestion from Time to Time. The Patient States She Is Not Able to Identify All the Foods That Cause It but States That Some Foods Do Seem to Bother Her More Than Others.  During These Episodes the Patient Denies Any Chest Pain,  Palpitations, Sweats, and She Has Not Had Any Recent Operations or Procedures Involving Her Abdomen or Chest. At This Time during My Interview the Patient States That She Feels Fine, but a Little Bit Shaky.      History reviewed. No pertinent past medical history.  Patient Active Problem List   Diagnosis Date Noted  . Anxiety as acute reaction to exceptional stress 08/29/2015  . Insomnia 08/29/2015  . Gastritis 03/05/2013    History reviewed. No pertinent surgical history.  OB History    No data available       Home Medications    Prior to Admission medications   Medication Sig Start Date End Date Taking? Authorizing Provider  DULoxetine (CYMBALTA) 30 MG capsule Take 1 capsule (30 mg total) by mouth daily. 02/09/16  Yes Campbell Riches, NP    Family History History reviewed. No pertinent family history.  Social History Social History  Substance Use Topics  . Smoking status: Never Smoker  . Smokeless tobacco: Never Used  . Alcohol use Yes     Comment: occas social     Allergies   Wellbutrin [bupropion]   Review of Systems Review of Systems  Respiratory: Negative for cough, shortness of breath and wheezing.   Cardiovascular: Negative for chest pain and palpitations.  Gastrointestinal: Positive for diarrhea and nausea.  Neurological: Positive for syncope and light-headedness.  Psychiatric/Behavioral: The patient is nervous/anxious.   All  other systems reviewed and are negative.    Physical Exam Updated Vital Signs BP 140/82 (BP Location: Left Arm)   Pulse 83   Temp 98 F (36.7 C) (Oral)   Resp 19   Ht 5\' 3"  (1.6 m)   Wt 77.6 kg   LMP 03/06/2016   SpO2 100%   BMI 30.29 kg/m   Physical Exam  Constitutional: She is oriented to person, place, and time. She appears well-developed and well-nourished.  Non-toxic appearance.  HENT:  Head: Normocephalic.    Right Ear: Tympanic membrane and external ear normal.  Left Ear: Tympanic membrane and  external ear normal.  Eyes: EOM and lids are normal. Pupils are equal, round, and reactive to light.  Neck: Normal range of motion. Neck supple. Carotid bruit is not present.  No carotid bruits.  Cardiovascular: Normal rate, regular rhythm, normal heart sounds, intact distal pulses and normal pulses.  Exam reveals no gallop and no friction rub.   Pulmonary/Chest: Breath sounds normal. No respiratory distress.  Patient speaks in complete sentences without problem. There is symmetrical rise and fall of the chest.  Abdominal: Soft. Bowel sounds are normal. There is no tenderness. There is no guarding.  No CVA tenderness.  Musculoskeletal: Normal range of motion.  Lymphadenopathy:       Head (right side): No submandibular adenopathy present.       Head (left side): No submandibular adenopathy present.    She has no cervical adenopathy.  Neurological: She is alert and oriented to person, place, and time. She has normal strength. No cranial nerve deficit or sensory deficit.  Skin: Skin is warm and dry. Capillary refill takes less than 2 seconds. No rash noted. No pallor.  Psychiatric: She has a normal mood and affect. Her speech is normal and behavior is normal. Thought content normal.  Nursing note and vitals reviewed.    ED Treatments / Results  Labs (all labs ordered are listed, but only abnormal results are displayed) Labs Reviewed  CBG MONITORING, ED - Abnormal; Notable for the following:       Result Value   Glucose-Capillary 125 (*)    All other components within normal limits    EKG  EKG Interpretation  Date/Time:  Tuesday March 13 2016 23:53:00 EDT Ventricular Rate:  84 PR Interval:    QRS Duration: 89 QT Interval:  375 QTC Calculation: 444 R Axis:   80 Text Interpretation:  Sinus rhythm Nonspecific T abnormalities, lateral leads No old tracing to compare Confirmed by WARD,  DO, KRISTEN ([id number]) on 03/13/2016 11:56:58 PM       Radiology No results  found.  Procedures Procedures (including critical care time)  Medications Ordered in ED Medications - No data to display   Initial Impression / Assessment and Plan / ED Course  I have reviewed the triage vital signs and the nursing notes.  Pertinent labs & imaging results that were available during my care of the patient were reviewed by me and considered in my medical decision making (see chart for details).  Clinical Course    **I have reviewed nursing notes, vital signs, and all appropriate lab and imaging results for this patient.*  Final Clinical Impressions(s) / ED Diagnoses  Vital signs within normal limits. The pulse oximetry is 100% on room air. The patient speaks to me in complete sentences without problem during the interview. Is no vomiting reported. The electrocardiogram shows a normal rhythm. There no acute events, or life threatening rhythms noted. Capillary blood  glucose is 125. Within normal limits by my interpretation. Troponins negative for event. Lipase is slightly elevated at 70. Suspect vasovagal response causing syncopal episode.  Additional labs pending. Pt in no distress at this time. Pt care to be continued by Dr Elesa Massed.   Final diagnoses:  None    New Prescriptions New Prescriptions   No medications on file     Ivery Quale, [place] 03/16/16 1711

## 2016-03-14 NOTE — ED Notes (Signed)
Patient ambulated to the bathroom with no assistance or issues.

## 2016-03-14 NOTE — ED Provider Notes (Signed)
Medical screening examination/treatment/procedure(s) were conducted as a shared visit with non-physician practitioner(s) and myself.  I personally evaluated the patient during the encounter.   EKG Interpretation  Date/Time:  Tuesday March 13 2016 23:53:00 EDT Ventricular Rate:  84 PR Interval:    QRS Duration: 89 QT Interval:  375 QTC Calculation: 444 R Axis:   80 Text Interpretation:  Sinus rhythm Nonspecific T abnormalities, lateral leads No old tracing to compare Confirmed by Elza Sortor,  DO, Shaelin Lalley ([id number]) on 03/13/2016 11:56:58 PM      Pt is a 45 y.o. female with no significant past medical history who had a syncopal episode after having nausea and diarrhea tonight. Denies preceding chest pain, shortness of breath, palpitations. No history of ACS, aortic aneurysm, PE or DVT. No fevers, dysuria or hematuria, vaginal bleeding or discharge, bloody stool or melena. Abdominal exam is benign. Heart sounds normal. Lungs clear. EKG shows no significant ischemic abnormality. She is nonspecific T wave flattening with no old for comparison. Labs show leukocytosis which is likely from viral illness versus reactive in nature. Troponin is negative.Urine pregnancy test negative. Urine showed trace ketones but no sign of infection. She is not orthostatic. Symptoms treated with Bentyl, Imodium, fluids.  She reports feeling better. Suspect a viral illness that cause vasovagal syncope. I feel she is safe for discharge.   Jillian MawKristen N Antwan Bribiesca, DO 03/14/16 0300

## 2016-03-20 ENCOUNTER — Telehealth: Payer: Self-pay | Admitting: Family Medicine

## 2016-03-20 DIAGNOSIS — E876 Hypokalemia: Secondary | ICD-10-CM

## 2016-03-20 DIAGNOSIS — R748 Abnormal levels of other serum enzymes: Secondary | ICD-10-CM

## 2016-03-20 MED ORDER — POLYETHYLENE GLYCOL 3350 17 GM/SCOOP PO POWD: 17.0000 g | Freq: Every day | ORAL | 0 refills | Status: DC

## 2016-03-20 NOTE — Telephone Encounter (Signed)
Pt has been constipated for over a week and is wanting to know if something can be called in. Please advise.      [facility]

## 2016-03-20 NOTE — Telephone Encounter (Signed)
Pt states last Tuesday she had a stomach ache and diarrhea. Passed out on the toliet. [facility] They gave immodium for diarrhea and has not had a BM since last Tuesday. No abd pain.

## 2016-03-20 NOTE — Telephone Encounter (Signed)
#  1 she may use MiraLAX one capful in 8 ounces daily. #2 if she is having any ongoing abdominal symptoms she also follow-up #3 she should repeat metabolic 7 and lipase this week because her potassium was low and her lipase was elevated when she was in the ER #4 if still no bowel movement by Thursday I would recommend that she try a suppository certainly follow-up if ongoing troubles

## 2016-03-20 NOTE — Telephone Encounter (Signed)
Spoke with patient and informed her per Dr.Scott Luking- #1 she may use MiraLAX one capful in 8 ounces daily. #2 if she is having any ongoing abdominal symptoms she also follow-up #3 she should repeat metabolic 7 and lipase this week because her potassium was low and her lipase was elevated when she was in the ER #4 if still no bowel movement by Thursday I would recommend that she try a suppository certainly follow-up if ongoing troubles. Patient verbalized understanding.

## 2016-03-22 LAB — BASIC METABOLIC PANEL
BUN / CREAT RATIO: 11 (ref 9–23)
BUN: 8 mg/dL (ref 6–24)
CHLORIDE: 100 mmol/L (ref 96–106)
CO2: 25 mmol/L (ref 18–29)
CREATININE: 0.73 mg/dL (ref 0.57–1.00)
Calcium: 9.2 mg/dL (ref 8.7–10.2)
GFR calc Af Amer: 115 mL/min/{1.73_m2} (ref >59)
GFR calc non Af Amer: 100 mL/min/{1.73_m2} (ref >59)
GLUCOSE: 84 mg/dL (ref 65–99)
POTASSIUM: 4 mmol/L (ref 3.5–5.2)
SODIUM: 140 mmol/L (ref 134–144)

## 2016-03-22 LAB — LIPASE: Lipase: 27 U/L (ref 0–59)

## 2016-04-06 ENCOUNTER — Other Ambulatory Visit: Payer: Self-pay | Admitting: Family Medicine

## 2016-05-31 ENCOUNTER — Other Ambulatory Visit: Payer: Self-pay | Admitting: Nurse Practitioner

## 2016-05-31 ENCOUNTER — Other Ambulatory Visit: Payer: Self-pay | Admitting: Family Medicine

## 2016-06-06 ENCOUNTER — Ambulatory Visit (INDEPENDENT_AMBULATORY_CARE_PROVIDER_SITE_OTHER): Payer: BLUE CROSS/BLUE SHIELD | Admitting: Nurse Practitioner

## 2016-06-06 VITALS — BP 144/92 | Temp 100.1°F | Wt 170.0 lb

## 2016-06-06 DIAGNOSIS — B349 Viral infection, unspecified: Secondary | ICD-10-CM

## 2016-06-06 MED ORDER — AZITHROMYCIN 250 MG PO TABS: ORAL_TABLET | ORAL | 0 refills | Status: DC

## 2016-06-08 ENCOUNTER — Encounter: Payer: Self-pay | Admitting: Nurse Practitioner

## 2016-06-08 NOTE — Progress Notes (Signed)
Subjective:  Presents complaints of fever sore throat body aches and nausea that began yesterday. Headache. Slight cough. No runny nose or wheezing. Some ear pain. General malaise. Vomiting only with brushing her teeth. No diarrhea or abdominal pain.  Objective:   BP (!) 144/92   Temp 100.1 F (37.8 C)   Wt 170 lb (77.1 kg)   BMI 30.11 kg/m  NAD. Alert, oriented. TMs mild clear effusion, no erythema. Pharynx clear moist. Neck supple with mild soft anterior adenopathy. Lungs clear. Heart regular rate rhythm.  Assessment: Viral illness  Plan:  Meds ordered this encounter  Medications  . azithromycin (ZITHROMAX Z-PAK) 250 MG tablet    Sig: Take 2 tablets (500 mg) on  Day 1,  followed by 1 tablet (250 mg) once daily on Days 2 through 5.    Dispense:  6 each    Refill:  0    Order Specific Question:   Supervising Provider    Answer:   Merlyn AlbertLUKING, WILLIAM S [[id number]]   Reviewed symptomatic care and warning signs. Given written prescription for Z-Pak and case symptoms worsen over the holiday weekend. Call back if symptoms worsen or persist.

## 2016-06-13 ENCOUNTER — Ambulatory Visit (INDEPENDENT_AMBULATORY_CARE_PROVIDER_SITE_OTHER): Payer: BLUE CROSS/BLUE SHIELD | Admitting: Nurse Practitioner

## 2016-06-13 ENCOUNTER — Encounter: Payer: Self-pay | Admitting: Nurse Practitioner

## 2016-06-13 VITALS — BP 110/70 | Ht 64.0 in | Wt 168.4 lb

## 2016-06-13 DIAGNOSIS — G4700 Insomnia, unspecified: Secondary | ICD-10-CM | POA: Diagnosis not present

## 2016-06-13 DIAGNOSIS — F411 Generalized anxiety disorder: Secondary | ICD-10-CM | POA: Diagnosis not present

## 2016-06-13 DIAGNOSIS — F430 Acute stress reaction: Secondary | ICD-10-CM | POA: Diagnosis not present

## 2016-06-13 MED ORDER — LEVONORGESTREL-ETHINYL ESTRAD 0.1-20 MG-MCG PO TABS: 1.0000 | ORAL_TABLET | Freq: Every day | ORAL | 5 refills | Status: DC

## 2016-06-13 NOTE — Progress Notes (Signed)
Subjective:  Presents for recheck on her anxiety. Overall doing better. Taking her Cymbalta 30 mg every other day which is working well. Cannot take medicine every day, causes hypersomnia and "does not feel right". Stress level has improved. Sleeping better.  Objective:   BP 110/70   Ht 5\' 4"  (1.626 m)   Wt 168 lb 6 oz (76.4 kg)   BMI 28.90 kg/m  NAD. Alert, oriented. Lungs clear. Heart regular rate rhythm. Thoughts logical coherent and relevant. Dressed appropriately. Cheerful affect. Good eye contact.  Assessment:  Problem List Items Addressed This Visit      Other   Anxiety as acute reaction to exceptional stress - Primary   Insomnia     Plan:  Meds ordered this encounter  Medications  . levonorgestrel-ethinyl estradiol (FALMINA) 0.1-20 MG-MCG tablet    Sig: Take 1 tablet by mouth daily.    Dispense:  28 tablet    Refill:  5    Needs office visit for additional refills.    Order Specific Question:   Supervising Provider    Answer:   Merlyn AlbertLUKING, WILLIAM S [[id number]]   Continue Cymbalta at present dose since this is working well for her. Also given refills of her birth control to allow her time to get in for physical. Understands risks associated with hormone use. Return in about 6 months (around 12/11/2016) for recheck.

## 2016-08-22 ENCOUNTER — Other Ambulatory Visit: Payer: Self-pay | Admitting: Nurse Practitioner

## 2016-11-12 ENCOUNTER — Other Ambulatory Visit: Payer: Self-pay | Admitting: Family Medicine

## 2016-11-12 ENCOUNTER — Other Ambulatory Visit: Payer: Self-pay | Admitting: Nurse Practitioner

## 2016-12-07 ENCOUNTER — Encounter: Payer: Self-pay | Admitting: Nurse Practitioner

## 2016-12-07 ENCOUNTER — Ambulatory Visit (INDEPENDENT_AMBULATORY_CARE_PROVIDER_SITE_OTHER): Payer: 59 | Admitting: Nurse Practitioner

## 2016-12-07 VITALS — BP 118/76 | Ht 64.0 in | Wt 168.2 lb

## 2016-12-07 DIAGNOSIS — G4700 Insomnia, unspecified: Secondary | ICD-10-CM

## 2016-12-07 DIAGNOSIS — K582 Mixed irritable bowel syndrome: Secondary | ICD-10-CM

## 2016-12-07 DIAGNOSIS — F419 Anxiety disorder, unspecified: Secondary | ICD-10-CM

## 2016-12-07 DIAGNOSIS — K219 Gastro-esophageal reflux disease without esophagitis: Secondary | ICD-10-CM

## 2016-12-07 DIAGNOSIS — M67471 Ganglion, right ankle and foot: Secondary | ICD-10-CM

## 2016-12-07 MED ORDER — PANTOPRAZOLE SODIUM 40 MG PO TBEC: 40.0000 mg | DELAYED_RELEASE_TABLET | Freq: Every day | ORAL | 1 refills | Status: DC

## 2016-12-07 MED ORDER — DICYCLOMINE HCL 10 MG PO CAPS: ORAL_CAPSULE | ORAL | 2 refills | Status: DC

## 2016-12-07 MED ORDER — DULOXETINE HCL 30 MG PO CPEP: ORAL_CAPSULE | ORAL | 1 refills | Status: DC

## 2016-12-07 MED ORDER — CLONAZEPAM 0.5 MG PO TABS: ORAL_TABLET | ORAL | 2 refills | Status: DC

## 2016-12-08 ENCOUNTER — Encounter: Payer: Self-pay | Admitting: Nurse Practitioner

## 2016-12-08 DIAGNOSIS — K582 Mixed irritable bowel syndrome: Secondary | ICD-10-CM | POA: Insufficient documentation

## 2016-12-08 DIAGNOSIS — M67471 Ganglion, right ankle and foot: Secondary | ICD-10-CM | POA: Insufficient documentation

## 2016-12-08 DIAGNOSIS — K219 Gastro-esophageal reflux disease without esophagitis: Secondary | ICD-10-CM | POA: Insufficient documentation

## 2016-12-08 NOTE — Progress Notes (Signed)
Subjective:  Presents for recheck on anxiety. Doing very well on Cymbalta. Taking occasional Klonopin. Sleeping well. Some changes at work has increased stress but patient is satisfied with current dose of medicine. Has noticed increased reflux again; taking protonix daily for now which is working well. Also having alternating constipation and diarrhea. No change in color of stools or blood noted. Has a "knot" on the top of the right foot. No change is size. Worse with wearing certain shoes.   Objective:   BP 118/76   Ht 5\' 4"  (1.626 m)   Wt 168 lb 3.2 oz (76.3 kg)   BMI 28.87 kg/m  NAD. Alert, oriented. Calm affect. Lungs clear. Heart RRR. Abdomen soft with active BS x 4. Non tender to palpation. Rubbery mobile nodule noted mid right foot. Non erythematous. Mildly tender to palpation.   Assessment:   Problem List Items Addressed This Visit      Digestive   Gastroesophageal reflux disease without esophagitis   Relevant Medications   pantoprazole (PROTONIX) 40 MG tablet   dicyclomine (BENTYL) 10 MG capsule   Irritable bowel syndrome with both constipation and diarrhea   Relevant Medications   pantoprazole (PROTONIX) 40 MG tablet   dicyclomine (BENTYL) 10 MG capsule     Other   Anxiety - Primary   Relevant Medications   DULoxetine (CYMBALTA) 30 MG capsule   Ganglion cyst of right foot   Insomnia       Plan:   Meds ordered this encounter  Medications  . DISCONTD: pantoprazole (PROTONIX) 40 MG tablet    Sig: Take 40 mg by mouth daily.  . clonazePAM (KLONOPIN) 0.5 MG tablet    Sig: TAKE ONE TABLET TWICE DAILY AS NEEDED FOR ANXIETY    Dispense:  30 tablet    Refill:  2    [facility]    Order Specific Question:   Supervising Provider    Answer:   Merlyn AlbertLUKING, WILLIAM S [[id number]]  . DULoxetine (CYMBALTA) 30 MG capsule    Sig: TAKE ONE (1) CAPSULE EACH DAY    Dispense:  90 capsule    Refill:  1    Order Specific Question:   Supervising Provider    Answer:   Merlyn AlbertLUKING, WILLIAM S  [[id number]]  . pantoprazole (PROTONIX) 40 MG tablet    Sig: Take 1 tablet (40 mg total) by mouth daily.    Dispense:  90 tablet    Refill:  1    Order Specific Question:   Supervising Provider    Answer:   Merlyn AlbertLUKING, WILLIAM S [[id number]]  . dicyclomine (BENTYL) 10 MG capsule    Sig: Take one po TID prn bowel spasms    Dispense:  30 capsule    Refill:  2    Order Specific Question:   Supervising Provider    Answer:   Merlyn AlbertLUKING, WILLIAM S [[id number]]   Continue current medications. Discussed importance of stress reduction. Defers referral for ganglion cyst.  Return in about 6 months (around 06/09/2017) for recheck. Call back sooner if needed.    Strongly encouraged preventive health physical.

## 2016-12-15 ENCOUNTER — Other Ambulatory Visit: Payer: Self-pay | Admitting: Nurse Practitioner

## 2016-12-17 ENCOUNTER — Other Ambulatory Visit: Payer: Self-pay | Admitting: Nurse Practitioner

## 2016-12-17 ENCOUNTER — Encounter: Payer: Self-pay | Admitting: Family Medicine

## 2016-12-17 ENCOUNTER — Telehealth: Payer: Self-pay | Admitting: Nurse Practitioner

## 2016-12-17 ENCOUNTER — Encounter: Payer: Self-pay | Admitting: Nurse Practitioner

## 2016-12-17 NOTE — Telephone Encounter (Signed)
Work excuse done

## 2016-12-17 NOTE — Telephone Encounter (Signed)
Please give work note for 6/7 and 6/8; return to work 6/11. Thanks.

## 2017-01-21 ENCOUNTER — Encounter: Payer: Self-pay | Admitting: Nurse Practitioner

## 2017-01-21 ENCOUNTER — Other Ambulatory Visit: Payer: Self-pay | Admitting: Nurse Practitioner

## 2017-01-21 MED ORDER — CEPHALEXIN 500 MG PO CAPS: 500.0000 mg | ORAL_CAPSULE | Freq: Three times a day (TID) | ORAL | 0 refills | Status: DC

## 2017-01-22 ENCOUNTER — Other Ambulatory Visit: Payer: Self-pay | Admitting: Nurse Practitioner

## 2017-01-22 MED ORDER — CEPHALEXIN 500 MG PO CAPS: 500.0000 mg | ORAL_CAPSULE | Freq: Three times a day (TID) | ORAL | 0 refills | Status: DC

## 2017-03-20 ENCOUNTER — Other Ambulatory Visit: Payer: Self-pay | Admitting: Nurse Practitioner

## 2017-03-22 ENCOUNTER — Other Ambulatory Visit: Payer: Self-pay | Admitting: *Deleted

## 2017-03-22 MED ORDER — PANTOPRAZOLE SODIUM 40 MG PO TBEC: 40.0000 mg | DELAYED_RELEASE_TABLET | Freq: Every day | ORAL | 5 refills | Status: DC

## 2017-04-03 ENCOUNTER — Other Ambulatory Visit: Payer: Self-pay | Admitting: Nurse Practitioner

## 2017-04-08 NOTE — Telephone Encounter (Signed)
This +3 refills 

## 2017-04-09 ENCOUNTER — Other Ambulatory Visit: Payer: Self-pay

## 2017-05-28 ENCOUNTER — Other Ambulatory Visit: Payer: Self-pay | Admitting: Nurse Practitioner

## 2017-06-10 ENCOUNTER — Ambulatory Visit (INDEPENDENT_AMBULATORY_CARE_PROVIDER_SITE_OTHER): Payer: 59 | Admitting: Nurse Practitioner

## 2017-06-10 ENCOUNTER — Encounter: Payer: Self-pay | Admitting: Nurse Practitioner

## 2017-06-10 VITALS — BP 142/88 | Ht 64.0 in | Wt 173.0 lb

## 2017-06-10 DIAGNOSIS — R5383 Other fatigue: Secondary | ICD-10-CM

## 2017-06-10 DIAGNOSIS — Z01419 Encounter for gynecological examination (general) (routine) without abnormal findings: Secondary | ICD-10-CM

## 2017-06-10 DIAGNOSIS — Z124 Encounter for screening for malignant neoplasm of cervix: Secondary | ICD-10-CM

## 2017-06-10 DIAGNOSIS — K5900 Constipation, unspecified: Secondary | ICD-10-CM

## 2017-06-10 DIAGNOSIS — Z1231 Encounter for screening mammogram for malignant neoplasm of breast: Secondary | ICD-10-CM

## 2017-06-10 DIAGNOSIS — Z1151 Encounter for screening for human papillomavirus (HPV): Secondary | ICD-10-CM | POA: Diagnosis not present

## 2017-06-10 NOTE — Progress Notes (Addendum)
Subjective:    Patient ID: Jillian Ford, female    DOB: 1970-08-30, 46 y.o.   MRN: [id number]  HPI The patient comes in today for a wellness visit.    A review of their health history was completed.  A review of medications was also completed.  Any needed refills;  Yes  Eating habits:  Patient states no concerns this visit.   Falls/  MVA accidents in past few months: None   Regular exercise:  Does not exercise. Specialist pt sees on regular basis: None   Preventative health issues were discussed.   Additional concerns: Patient has concerns of constipation.  Regular menses, light flow.  Same sexual partner.  Has had an eye exam this year.  Has not had a dental exam in a long time.  Rare social alcohol.  Experiencing some fatigue.  Has not had a bowel movement in almost a week.  Has a history of recurrent constipation.  Has tried 1 dose of MiraLAX with no results.  No blood in her stool.  Has noticed more reflux lately, this was under control. not currently taking her Protonix.  Review of Systems  Constitutional: Positive for fatigue. Negative for activity change, appetite change and fever.  HENT: Negative for dental problem, ear pain, sinus pressure and sore throat.   Respiratory: Negative for cough, chest tightness, shortness of breath and wheezing.   Cardiovascular: Negative for chest pain.  Gastrointestinal: Positive for abdominal pain and constipation. Negative for abdominal distention, blood in stool, diarrhea, nausea and vomiting.  Genitourinary: Negative for difficulty urinating, dysuria, enuresis, frequency, genital sores, menstrual problem, pelvic pain, urgency and vaginal discharge.  Psychiatric/Behavioral: Negative for sleep disturbance.   Depression screen PHQ 2/9 06/10/2017  Decreased Interest 1  Down, Depressed, Hopeless 1  PHQ - 2 Score 2        Objective:   Physical Exam  Constitutional: She is oriented to person, place, and time. She appears  well-developed. No distress.  HENT:  Right Ear: External ear normal.  Left Ear: External ear normal.  Mouth/Throat: Oropharynx is clear and moist.  Neck: Normal range of motion. Neck supple. No tracheal deviation present. No thyromegaly present.  Cardiovascular: Normal rate, regular rhythm and normal heart sounds. Exam reveals no gallop.  No murmur heard. Pulmonary/Chest: Effort normal and breath sounds normal. Right breast exhibits no inverted nipple, no mass, no skin change and no tenderness. Left breast exhibits no inverted nipple, no mass, no skin change and no tenderness. Breasts are symmetrical.  Axillae no adenopathy.  Abdominal: Soft. She exhibits no distension and no mass. There is tenderness. There is no rebound and no guarding.  Minimal epigastric area tenderness and mild generalized lower abdominal tenderness.  No rebound or guarding.  No obvious masses.  Genitourinary: Vagina normal and uterus normal. No vaginal discharge found.  Genitourinary Comments: External GU: No rashes or lesions.  Vagina no discharge.  Cervix normal limits in appearance.  Mild sclerotic changes at the office.  Mild spotting with Pap smear.  No CMT.  Bimanual exam no tenderness or obvious masses.  Musculoskeletal: She exhibits no edema.  Lymphadenopathy:    She has no cervical adenopathy.  Neurological: She is alert and oriented to person, place, and time.  Skin: Skin is warm and dry. No rash noted.  Psychiatric: She has a normal mood and affect. Her behavior is normal.  Vitals reviewed.         Assessment & Plan:  Well woman exam - Plan:  Pap IG and HPV (high risk) DNA detection, CBC with Differential/Platelet, Basic metabolic panel, Lipid panel, Hepatic function panel, VITAMIN D 25 Hydroxy (Vit-D Deficiency, Fractures), TSH  Screening for cervical cancer - Plan: Pap IG and HPV (high risk) DNA detection  Screening for HPV (human papillomavirus) - Plan: Pap IG and HPV (high risk) DNA  detection  Other fatigue - Plan: CBC with Differential/Platelet, VITAMIN D 25 Hydroxy (Vit-D Deficiency, Fractures), TSH  Screening mammogram, encounter for - Plan: MM DIGITAL SCREENING BILATERAL  Constipation, unspecified constipation type  Try Fleets enema or Magnesium citrate for constipation; start Benefiber as directed.  Given information on dietary measures to help constipation.  Note patient eats very limited fruits and vegetables. Restart Pantoprazole daily for 2 weeks; call back at that time if upper abdominal pain or reflux persists.  Patient verbalizes understanding.  May need a GI referral at that time.  Lab work pending.  Has been having some hot flashes lately, given information on natural supplements.  Discussed risk associated with her age and OC use.  Non-smoker.  Discussed other options.  Wishes to continue her current pill for now.  BP slightly elevated today.  Patient to check BP outside the office for the next few weeks and call back if remains above 140/90. Return in about 6 months (around 12/08/2017) for recheck. Call back by the end of the week if no improvement in constipation.

## 2017-06-10 NOTE — Patient Instructions (Addendum)
Flaxseed Soy Black cohosh Estroven  Try Fleets enema or Magnesium citrate for constipation; start Benefiber as directed Restart Pantoprazole daily for 2 weeks; call back at that time if upper abdominal pain or reflux persists   Constipation, Adult Constipation is when a person:  Poops (has a bowel movement) fewer times in a week than normal.  Has a hard time pooping.  Has poop that is dry, hard, or bigger than normal.  Follow these instructions at home: Eating and drinking   Eat foods that have a lot of fiber, such as: ? Fresh fruits and vegetables. ? Whole grains. ? Beans.  Eat less of foods that are high in fat, low in fiber, or overly processed, such as: ? Jamaica[place] fries. ? Hamburgers. ? Cookies. ? Candy. ? Soda.  Drink enough fluid to keep your pee (urine) clear or pale yellow. General instructions  Exercise regularly or as told by your doctor.  Go to the restroom when you feel like you need to poop. Do not hold it in.  Take over-the-counter and prescription medicines only as told by your doctor. These include any fiber supplements.  Do pelvic floor retraining exercises, such as: ? Doing deep breathing while relaxing your lower belly (abdomen). ? Relaxing your pelvic floor while pooping.  Watch your condition for any changes.  Keep all follow-up visits as told by your doctor. This is important. Contact a doctor if:  You have pain that gets worse.  You have a fever.  You have not pooped for 4 days.  You throw up (vomit).  You are not hungry.  You lose weight.  You are bleeding from the anus.  You have thin, pencil-like poop (stool). Get help right away if:  You have a fever, and your symptoms suddenly get worse.  You leak poop or have blood in your poop.  Your belly feels hard or bigger than normal (is bloated).  You have very bad belly pain.  You feel dizzy or you faint. This information is not intended to replace advice given to you by  your health care provider. Make sure you discuss any questions you have with your health care provider. Document Released: 12/19/2007 Document Revised: 01/20/2016 Document Reviewed: 12/21/2015 Elsevier Interactive Patient Education  2017 [employer]

## 2017-06-11 LAB — BASIC METABOLIC PANEL
BUN / CREAT RATIO: 10 (ref 9–23)
BUN: 9 mg/dL (ref 6–24)
CO2: 24 mmol/L (ref 20–29)
Calcium: 9.7 mg/dL (ref 8.7–10.2)
Chloride: 102 mmol/L (ref 96–106)
Creatinine, Ser: 0.86 mg/dL (ref 0.57–1.00)
GFR calc Af Amer: 94 mL/min/{1.73_m2} (ref >59)
GFR calc non Af Amer: 81 mL/min/{1.73_m2} (ref >59)
GLUCOSE: 100 mg/dL — AB (ref 65–99)
POTASSIUM: 4.2 mmol/L (ref 3.5–5.2)
SODIUM: 141 mmol/L (ref 134–144)

## 2017-06-11 LAB — HEPATIC FUNCTION PANEL
ALT: 19 IU/L (ref 0–32)
AST: 17 IU/L (ref 0–40)
Albumin: 4.4 g/dL (ref 3.5–5.5)
Alkaline Phosphatase: 95 IU/L (ref 39–117)
Bilirubin Total: 0.5 mg/dL (ref 0.0–1.2)
Bilirubin, Direct: 0.17 mg/dL (ref 0.00–0.40)
Total Protein: 7.4 g/dL (ref 6.0–8.5)

## 2017-06-11 LAB — CBC WITH DIFFERENTIAL/PLATELET
BASOS ABS: 0.1 10*3/uL (ref 0.0–0.2)
Basos: 1 %
EOS (ABSOLUTE): 0.2 10*3/uL (ref 0.0–0.4)
Eos: 2 %
Hematocrit: 47.3 % — ABNORMAL HIGH (ref 34.0–46.6)
Hemoglobin: 16.6 g/dL — ABNORMAL HIGH (ref 11.1–15.9)
Immature Grans (Abs): 0 10*3/uL (ref 0.0–0.1)
Immature Granulocytes: 0 %
LYMPHS ABS: 2.1 10*3/uL (ref 0.7–3.1)
Lymphs: 18 %
MCH: 31.6 pg (ref 26.6–33.0)
MCHC: 35.1 g/dL (ref 31.5–35.7)
MCV: 90 fL (ref 79–97)
MONOS ABS: 0.5 10*3/uL (ref 0.1–0.9)
Monocytes: 5 %
NEUTROS ABS: 8.6 10*3/uL — AB (ref 1.4–7.0)
Neutrophils: 74 %
Platelets: 440 10*3/uL — ABNORMAL HIGH (ref 150–379)
RBC: 5.26 x10E6/uL (ref 3.77–5.28)
RDW: 12.7 % (ref 12.3–15.4)
WBC: 11.4 10*3/uL — AB (ref 3.4–10.8)

## 2017-06-11 LAB — LIPID PANEL
CHOL/HDL RATIO: 1.7 ratio (ref 0.0–4.4)
CHOLESTEROL TOTAL: 121 mg/dL (ref 100–199)
HDL: 71 mg/dL (ref >39)
LDL CALC: 40 mg/dL (ref 0–99)
TRIGLYCERIDES: 51 mg/dL (ref 0–149)
VLDL Cholesterol Cal: 10 mg/dL (ref 5–40)

## 2017-06-11 LAB — VITAMIN D 25 HYDROXY (VIT D DEFICIENCY, FRACTURES): VIT D 25 HYDROXY: 47.5 ng/mL (ref 30.0–100.0)

## 2017-06-11 LAB — TSH: TSH: 1.66 u[IU]/mL (ref 0.450–4.500)

## 2017-06-12 LAB — PAP IG AND HPV HIGH-RISK
HPV, high-risk: POSITIVE — AB
PAP Smear Comment: 0

## 2017-06-28 ENCOUNTER — Other Ambulatory Visit: Payer: Self-pay | Admitting: Nurse Practitioner

## 2017-07-01 ENCOUNTER — Encounter: Payer: Self-pay | Admitting: Nurse Practitioner

## 2017-07-01 ENCOUNTER — Other Ambulatory Visit: Payer: Self-pay | Admitting: *Deleted

## 2017-07-01 ENCOUNTER — Other Ambulatory Visit: Payer: Self-pay | Admitting: Nurse Practitioner

## 2017-07-01 ENCOUNTER — Telehealth: Payer: Self-pay | Admitting: Family Medicine

## 2017-07-01 MED ORDER — LEVONORGESTREL-ETHINYL ESTRAD 0.1-20 MG-MCG PO TABS: 1.0000 | ORAL_TABLET | Freq: Every day | ORAL | 11 refills | Status: DC

## 2017-07-01 MED ORDER — LEVONORGESTREL-ETHINYL ESTRAD 0.1-20 MG-MCG PO TABS: 1.0000 | ORAL_TABLET | Freq: Every day | ORAL | 0 refills | Status: DC

## 2017-07-01 MED ORDER — DICYCLOMINE HCL 10 MG PO CAPS: ORAL_CAPSULE | ORAL | 2 refills | Status: DC

## 2017-07-01 NOTE — Telephone Encounter (Signed)
Pt was seen a couple weeks ago for her physical and is needing the rest of her refills sent in on her birth control   [facility]

## 2017-07-01 NOTE — Telephone Encounter (Signed)
Done

## 2017-07-02 ENCOUNTER — Other Ambulatory Visit: Payer: Self-pay | Admitting: *Deleted

## 2017-07-02 ENCOUNTER — Other Ambulatory Visit: Payer: Self-pay | Admitting: Nurse Practitioner

## 2017-07-02 ENCOUNTER — Encounter: Payer: Self-pay | Admitting: Nurse Practitioner

## 2017-07-02 MED ORDER — DICYCLOMINE HCL 10 MG PO CAPS: ORAL_CAPSULE | ORAL | 2 refills | Status: DC

## 2017-08-07 ENCOUNTER — Other Ambulatory Visit: Payer: Self-pay | Admitting: Family Medicine

## 2017-08-24 ENCOUNTER — Encounter: Payer: Self-pay | Admitting: Nurse Practitioner

## 2017-08-24 ENCOUNTER — Other Ambulatory Visit: Payer: Self-pay | Admitting: Nurse Practitioner

## 2017-09-09 ENCOUNTER — Other Ambulatory Visit: Payer: Self-pay | Admitting: Family Medicine

## 2017-09-09 NOTE — Telephone Encounter (Signed)
May have 4875-month supply will need a follow-up office visit regarding this issue

## 2017-11-05 ENCOUNTER — Encounter: Payer: Self-pay | Admitting: Nurse Practitioner

## 2017-11-05 ENCOUNTER — Ambulatory Visit: Payer: 59 | Admitting: Nurse Practitioner

## 2017-11-05 VITALS — BP 134/72 | Ht 64.0 in | Wt 167.0 lb

## 2017-11-05 DIAGNOSIS — Z309 Encounter for contraceptive management, unspecified: Secondary | ICD-10-CM | POA: Diagnosis not present

## 2017-11-05 DIAGNOSIS — F419 Anxiety disorder, unspecified: Secondary | ICD-10-CM

## 2017-11-05 MED ORDER — CLONAZEPAM 1 MG PO TABS: 1.0000 mg | ORAL_TABLET | Freq: Two times a day (BID) | ORAL | 2 refills | Status: DC | PRN

## 2017-11-05 NOTE — Patient Instructions (Signed)
Increase Cymbalta to 60 mg daily; contact office Friday to let us know how it is doing

## 2017-11-06 ENCOUNTER — Encounter: Payer: Self-pay | Admitting: Nurse Practitioner

## 2017-11-06 NOTE — Progress Notes (Signed)
Subjective: Presents to discuss her anxiety.  Cymbalta is not working as well over the past couple of months.  Her main stress is due to her job.  Has taken 2 of her 0.5 mg Klonopin at a time which does seem to help, takes this about 9 AM.  Overall sleeping well at nighttime.  Does not take any Klonopin at night.  Doing well on her current birth control pill.  Regular cycles, normal flow.  Questioning whether she wants to switch methods, is hesitant because her acne has been doing extremely well.  Objective:   BP 134/72   Ht 5\' 4"  (1.626 m)   Wt 167 lb (75.8 kg)   LMP 10/15/2017   BMI 28.67 kg/m  NAD.  Alert, oriented.  Mildly anxious affect.  Thoughts logical coherent and relevant.  Tearing up at times during office visit.  Dressed appropriately.  Making good eye contact.  Lungs clear.  Heart regular rate and rhythm.  Assessment:   Problem List Items Addressed This Visit      Other   Anxiety - Primary       Plan:   Meds ordered this encounter  Medications  . clonazePAM (KLONOPIN) 1 MG tablet    Sig: Take 1 tablet (1 mg total) by mouth 2 (two) times daily as needed for anxiety.    Dispense:  40 tablet    Refill:  2    Order Specific Question:   Supervising Provider    Answer:   Merlyn AlbertLUKING, WILLIAM S [[id number]]   Increase Klonopin to 1 mg dose no more than twice per day as needed.  Increase Cymbalta to 60 mg/day, contact office by the end of the week to let us know how this dosage is working.  Lengthy discussion about stress reduction.  Encouraged regular activity.  Consider job change if this is possible.  Also discussed risk with OC use for her age.  Non-smoker.  Otherwise healthy.  After discussion patient elects to continue current pill for now.  To notify office if she wishes to try a different method. Return in about 3 months (around 02/04/2018) for recheck.

## 2017-11-08 ENCOUNTER — Other Ambulatory Visit: Payer: Self-pay | Admitting: Family Medicine

## 2017-11-08 NOTE — Telephone Encounter (Signed)
The last note on the patient's chart by Eber Jonesarolyn stated that Cymbalta would be increased to 60 mg, medication list says 30 mg, please clarify with Eber JonesCarolyn and reorder according to what she directs

## 2017-11-13 ENCOUNTER — Ambulatory Visit: Payer: 59 | Admitting: Family Medicine

## 2017-11-13 ENCOUNTER — Encounter: Payer: Self-pay | Admitting: Family Medicine

## 2017-11-13 VITALS — BP 140/108 | Temp 99.7°F | Ht 64.0 in | Wt 167.0 lb

## 2017-11-13 DIAGNOSIS — J019 Acute sinusitis, unspecified: Secondary | ICD-10-CM | POA: Diagnosis not present

## 2017-11-13 DIAGNOSIS — R6889 Other general symptoms and signs: Secondary | ICD-10-CM | POA: Diagnosis not present

## 2017-11-13 DIAGNOSIS — J029 Acute pharyngitis, unspecified: Secondary | ICD-10-CM | POA: Diagnosis not present

## 2017-11-13 LAB — POCT RAPID STREP A (OFFICE): Rapid Strep A Screen: NEGATIVE

## 2017-11-13 MED ORDER — LEVONORGESTREL-ETHINYL ESTRAD 0.1-20 MG-MCG PO TABS: ORAL_TABLET | ORAL | 3 refills | Status: DC

## 2017-11-13 MED ORDER — DULOXETINE HCL 60 MG PO CPEP: 60.0000 mg | ORAL_CAPSULE | Freq: Every day | ORAL | 5 refills | Status: DC

## 2017-11-13 MED ORDER — AMOXICILLIN 500 MG PO TABS: 500.0000 mg | ORAL_TABLET | Freq: Three times a day (TID) | ORAL | 0 refills | Status: DC

## 2017-11-13 NOTE — Progress Notes (Signed)
   Subjective:    Patient ID: Jillian Ford, female    DOB: [birth year], 47 y.o.   MRN: [id number]  HPI Patient is here today with complaints of a sore throat, headache and ,cough,gland hurt started yesterday. Has not taken any medications for this. Patient relates significant body aches head congestion not feeling good low energy present over the past couple days also sinus pressure and drainage denies wheezing difficulty breathing  Also has underlying anxiety issues saw Eber Jones for this recently and is doing better on the Cymbalta 60 mg daily she is requesting to go ahead and go with that higher dose she states she uses her clonazepam responsibly  Review of Systems  Constitutional: Negative for activity change and fever.  HENT: Positive for congestion and rhinorrhea. Negative for ear pain.   Eyes: Negative for discharge.  Respiratory: Positive for cough. Negative for shortness of breath and wheezing.   Cardiovascular: Negative for chest pain, palpitations and leg swelling.       Objective:   Physical Exam  Constitutional: She appears well-developed.  HENT:  Head: Normocephalic.  Nose: Nose normal.  Mouth/Throat: Oropharynx is clear and moist. No oropharyngeal exudate.  Neck: Neck supple.  Cardiovascular: Normal rate and normal heart sounds.  No murmur heard. Pulmonary/Chest: Effort normal and breath sounds normal. She has no wheezes.  Lymphadenopathy:    She has no cervical adenopathy.  Skin: Skin is warm and dry.  Nursing note and vitals reviewed.    Results for orders placed or performed in visit on 11/13/17  POCT rapid strep A  Result Value Ref Range   Rapid Strep A Screen Negative Negative        Assessment & Plan:  Flulike illness Acute rhinosinusitis Sore throat negative strep Antibiotic prescribed Rest up over the next few days warning signs discussed follow-up if problems  Generalized anxiety disorder may continue to use Cymbalta 60 mg daily follow-up with  Eber Jones within 6 months patient voices understanding follow-up sooner problems

## 2017-11-14 LAB — SPECIMEN STATUS REPORT

## 2017-11-14 LAB — STREP A DNA PROBE: Strep Gp A Direct, DNA Probe: NEGATIVE

## 2017-12-10 ENCOUNTER — Ambulatory Visit: Payer: 59 | Admitting: Nurse Practitioner

## 2017-12-13 ENCOUNTER — Other Ambulatory Visit: Payer: Self-pay | Admitting: Family Medicine

## 2018-02-03 ENCOUNTER — Ambulatory Visit: Payer: 59 | Admitting: Nurse Practitioner

## 2018-02-06 ENCOUNTER — Other Ambulatory Visit: Payer: Self-pay | Admitting: Nurse Practitioner

## 2018-06-04 ENCOUNTER — Encounter: Payer: Self-pay | Admitting: Family Medicine

## 2018-06-24 ENCOUNTER — Encounter: Payer: Self-pay | Admitting: Family Medicine

## 2018-08-05 ENCOUNTER — Ambulatory Visit: Payer: 59 | Admitting: Family Medicine

## 2018-10-15 ENCOUNTER — Other Ambulatory Visit: Payer: Self-pay | Admitting: Family Medicine

## 2018-10-15 NOTE — Telephone Encounter (Signed)
3 refills then will need an OV

## 2018-12-11 ENCOUNTER — Other Ambulatory Visit: Payer: Self-pay | Admitting: *Deleted

## 2018-12-11 ENCOUNTER — Telehealth: Payer: Self-pay | Admitting: Family Medicine

## 2018-12-11 MED ORDER — LEVONORGESTREL-ETHINYL ESTRAD 0.1-20 MG-MCG PO TABS: ORAL_TABLET | ORAL | 0 refills | Status: DC

## 2018-12-11 NOTE — Telephone Encounter (Signed)
One month ok per dr Lorin Picket. Refill sent. Pt notified.

## 2018-12-11 NOTE — Telephone Encounter (Signed)
Pt needs refill for FALMINA 0.1-20 MG-MCG tablet  Pt has physical scheduled for 12/22/2018 w/Carolyn  Please send to [facility] & call pt when done

## 2018-12-22 ENCOUNTER — Encounter: Payer: Self-pay | Admitting: Nurse Practitioner

## 2018-12-24 ENCOUNTER — Other Ambulatory Visit: Payer: Self-pay

## 2018-12-24 ENCOUNTER — Ambulatory Visit (INDEPENDENT_AMBULATORY_CARE_PROVIDER_SITE_OTHER): Payer: 59 | Admitting: Nurse Practitioner

## 2018-12-24 VITALS — BP 132/86 | Temp 97.9°F | Wt 188.6 lb

## 2018-12-24 DIAGNOSIS — Z1231 Encounter for screening mammogram for malignant neoplasm of breast: Secondary | ICD-10-CM

## 2018-12-24 DIAGNOSIS — R5383 Other fatigue: Secondary | ICD-10-CM | POA: Diagnosis not present

## 2018-12-24 DIAGNOSIS — Z01419 Encounter for gynecological examination (general) (routine) without abnormal findings: Secondary | ICD-10-CM

## 2018-12-24 DIAGNOSIS — Z1151 Encounter for screening for human papillomavirus (HPV): Secondary | ICD-10-CM | POA: Diagnosis not present

## 2018-12-24 DIAGNOSIS — Z0000 Encounter for general adult medical examination without abnormal findings: Secondary | ICD-10-CM | POA: Diagnosis not present

## 2018-12-24 DIAGNOSIS — Z124 Encounter for screening for malignant neoplasm of cervix: Secondary | ICD-10-CM | POA: Diagnosis not present

## 2018-12-24 NOTE — Progress Notes (Signed)
Subjective:    Patient ID: Jillian Ford, female    DOB: 1971-06-29, 48 y.o.   MRN: [id number]  HPI The patient comes in today for a wellness visit.    A review of their health history was completed.  A review of medications was also completed.  Any needed refills; Pt would refills on Bentyl and Protonix  Eating habits: trying to eat healthy  Falls/  MVA accidents in past few months: none  Regular exercise: none  Specialist pt sees on regular basis: non  Preventative health issues were discussed.   Additional concerns: pt states right ear is muffled. Has been going on about a month. No pain.    Review of Systems  Constitutional: Negative for activity change, appetite change and fatigue.  HENT: Negative for dental problem, ear pain, sinus pressure and sore throat.   Respiratory: Negative for cough, chest tightness, shortness of breath and wheezing.   Cardiovascular: Negative for chest pain.  Gastrointestinal: Negative for abdominal distention, abdominal pain, constipation, diarrhea, nausea and vomiting.  Genitourinary: Negative for difficulty urinating, dysuria, enuresis, frequency, genital sores, menstrual problem, pelvic pain, urgency and vaginal discharge.   Presents for her wellness exam.  Has not been eating very healthy lately.  Limited activity.  States she is gained about 11 pounds recently.  Reflux has been stable, usually related to certain foods, does not occur every day.  Same sexual partner.  Currently on birth control pills.  Cycles are regular lasting about 3 days with light flow.  Needs an eye exam.  Also needs a dental exam.    Objective:   Physical Exam Constitutional:      General: She is not in acute distress.    Appearance: She is well-developed.  Neck:     Musculoskeletal: Normal range of motion and neck supple.     Thyroid: No thyromegaly.     Trachea: No tracheal deviation.  Cardiovascular:     Rate and Rhythm: Normal rate and regular rhythm.     Heart sounds: Normal heart sounds. No murmur. No gallop.   Pulmonary:     Effort: Pulmonary effort is normal.     Breath sounds: Normal breath sounds.  Chest:     Breasts:        Right: Normal. No inverted nipple, mass, nipple discharge, skin change or tenderness.        Left: Normal. No inverted nipple, mass, nipple discharge, skin change or tenderness.  Abdominal:     General: There is no distension.     Palpations: Abdomen is soft.     Tenderness: There is no abdominal tenderness.  Genitourinary:    General: Normal vulva.     Vagina: Normal. No vaginal discharge.     Comments: External GU normal limit.  Vagina no discharge noted.  Cervix normal limit in appearance.  Bimanual exam: No CMT, uterus and adnexa no masses or tenderness. Lymphadenopathy:     Cervical: No cervical adenopathy.     Upper Body:     Right upper body: No axillary or pectoral adenopathy.     Left upper body: No axillary or pectoral adenopathy.  Skin:    General: Skin is warm and dry.     Findings: No rash.  Neurological:     Mental Status: She is alert and oriented to person, place, and time.  Psychiatric:        Behavior: Behavior normal.           Assessment & Plan:  1. Well woman exam with routine gynecological exam - Pap IG and HPV (high risk) DNA detection - Lipid panel - TSH - Vitamin D (25 hydroxy) - COMPLETE METABOLIC PANEL WITH GFR  2. Screening for cervical cancer - Pap IG and HPV (high risk) DNA detection  3. Screening for HPV (human papillomavirus) - Pap IG and HPV (high risk) DNA detection  4. Fatigue, unspecified type - Lipid panel - TSH - Vitamin D (25 hydroxy) - COMPLETE METABOLIC PANEL WITH GFR  5. Screening mammogram, encounter for - MM Digital Screening; Future  Encouraged healthy diet and regular activity.  Discussed importance of stress reduction.  Recommend vision and dental exams.  Also discussed risk of sun exposure and use of sunscreen.  Recommend she consider  regular skin cancer screening with dermatology. Return in about 1 year (around 12/24/2019) for physical.

## 2018-12-24 NOTE — Patient Instructions (Signed)
Paraguard IUD Mirena IUD Depo Provera Nexplanon

## 2018-12-26 ENCOUNTER — Encounter: Payer: Self-pay | Admitting: Nurse Practitioner

## 2019-01-02 LAB — PAP IG AND HPV HIGH-RISK: HPV, high-risk: NEGATIVE

## 2019-01-04 ENCOUNTER — Other Ambulatory Visit: Payer: Self-pay | Admitting: Family Medicine

## 2019-01-05 ENCOUNTER — Other Ambulatory Visit: Payer: Self-pay

## 2019-01-05 ENCOUNTER — Ambulatory Visit (HOSPITAL_COMMUNITY)
Admission: RE | Admit: 2019-01-05 | Discharge: 2019-01-05 | Disposition: A | Discharge status: Home / Self Care | Payer: 59 | Source: Ambulatory Visit | Attending: Nurse Practitioner | Admitting: Nurse Practitioner

## 2019-01-05 DIAGNOSIS — Z1231 Encounter for screening mammogram for malignant neoplasm of breast: Secondary | ICD-10-CM | POA: Diagnosis present

## 2019-01-09 ENCOUNTER — Telehealth: Payer: Self-pay | Admitting: Family Medicine

## 2019-01-09 ENCOUNTER — Other Ambulatory Visit: Payer: Self-pay | Admitting: Nurse Practitioner

## 2019-01-09 MED ORDER — LEVONORGESTREL-ETHINYL ESTRAD 0.1-20 MG-MCG PO TABS: ORAL_TABLET | ORAL | 11 refills | Status: DC

## 2019-01-09 NOTE — Telephone Encounter (Signed)
Patient was seen by Hoyle Sauer on 6/10 and needing her birth control refilled.  [place]

## 2019-03-18 ENCOUNTER — Encounter: Payer: Self-pay | Admitting: Family Medicine

## 2019-04-02 LAB — LIPID PANEL
Chol/HDL Ratio: 1.9 ratio (ref 0.0–4.4)
Cholesterol, Total: 129 mg/dL (ref 100–199)
HDL: 68 mg/dL (ref >39)
LDL Chol Calc (NIH): 50 mg/dL (ref 0–99)
Triglycerides: 49 mg/dL (ref 0–149)
VLDL Cholesterol Cal: 11 mg/dL (ref 5–40)

## 2019-04-02 LAB — VITAMIN D 25 HYDROXY (VIT D DEFICIENCY, FRACTURES): Vit D, 25-Hydroxy: 78.8 ng/mL (ref 30.0–100.0)

## 2019-04-02 LAB — TSH: TSH: 1.66 u[IU]/mL (ref 0.450–4.500)

## 2019-06-02 ENCOUNTER — Other Ambulatory Visit: Payer: Self-pay

## 2019-06-02 ENCOUNTER — Encounter: Payer: Self-pay | Admitting: Family Medicine

## 2019-06-02 ENCOUNTER — Ambulatory Visit (INDEPENDENT_AMBULATORY_CARE_PROVIDER_SITE_OTHER): Payer: 59 | Admitting: Family Medicine

## 2019-06-02 VITALS — BP 136/82 | Temp 98.3°F | Wt 191.0 lb

## 2019-06-02 DIAGNOSIS — H9319 Tinnitus, unspecified ear: Secondary | ICD-10-CM | POA: Diagnosis not present

## 2019-06-02 DIAGNOSIS — H9190 Unspecified hearing loss, unspecified ear: Secondary | ICD-10-CM

## 2019-06-02 DIAGNOSIS — R42 Dizziness and giddiness: Secondary | ICD-10-CM

## 2019-06-02 NOTE — Progress Notes (Signed)
   Subjective:    Patient ID: Jillian Ford, female    DOB: [birth year], 48 y.o.   MRN: [id number]  HPI Pt has been having a ringing in her right ear for several months. Pt was here in June for wellness and Hoyle Sauer stated that pt had a lot of fluid in ears. Pt states her hearing has diminished in right ear also. Has tried Flonase with no relief.  Pt states that she is also having dizziness. Not constant. Pt states she can lean over to the side and would become dizzy.   Right ear  Ring ing high pitch    Muffle hearing on tha t side  Notes dizzines  , owrse in the morning  Notes incr if  Leans  Over to pick up soething in a certain way  Pretty much gone after the afternoon  Uses flonase,  Was hoping  flonas would help open things up, caolyn saw "fluid" Review of Systems     Objective:   Physical Exam  Alert vitals stable, NAD. Blood pressure good on repeat. HEENT normal. Lungs clear. Heart regular rate and rhythm. External ear canals within normal limits tympanic membranes within normal limits      Assessment & Plan:  Impression persistent right ear tinnitus/diminished hearing/ear fullness with intermittent vertigo-like symptoms.  Progressive over several months.  Negative cerebellar findings on exam.  Time for ENT referral rationale discussed

## 2019-06-05 ENCOUNTER — Other Ambulatory Visit: Payer: Self-pay

## 2019-06-05 DIAGNOSIS — Z20822 Contact with and (suspected) exposure to covid-19: Secondary | ICD-10-CM

## 2019-06-07 ENCOUNTER — Telehealth: Payer: Self-pay

## 2019-06-07 NOTE — Telephone Encounter (Signed)
Pt called for covid results. Advised results are not back. 

## 2019-06-08 LAB — NOVEL CORONAVIRUS, NAA: SARS-CoV-2, NAA: DETECTED — AB

## 2019-06-10 ENCOUNTER — Encounter: Payer: Self-pay | Admitting: Family Medicine

## 2019-06-25 ENCOUNTER — Ambulatory Visit (INDEPENDENT_AMBULATORY_CARE_PROVIDER_SITE_OTHER): Payer: 59 | Admitting: Otolaryngology

## 2019-06-25 DIAGNOSIS — H9041 Sensorineural hearing loss, unilateral, right ear, with unrestricted hearing on the contralateral side: Secondary | ICD-10-CM | POA: Diagnosis not present

## 2019-06-25 DIAGNOSIS — R42 Dizziness and giddiness: Secondary | ICD-10-CM | POA: Diagnosis not present

## 2019-06-26 ENCOUNTER — Other Ambulatory Visit: Payer: Self-pay | Admitting: Otolaryngology

## 2019-06-26 ENCOUNTER — Other Ambulatory Visit (HOSPITAL_COMMUNITY): Payer: Self-pay | Admitting: Otolaryngology

## 2019-06-26 DIAGNOSIS — H9041 Sensorineural hearing loss, unilateral, right ear, with unrestricted hearing on the contralateral side: Secondary | ICD-10-CM

## 2019-06-26 DIAGNOSIS — IMO0001 Reserved for inherently not codable concepts without codable children: Secondary | ICD-10-CM

## 2019-06-30 DIAGNOSIS — H9041 Sensorineural hearing loss, unilateral, right ear, with unrestricted hearing on the contralateral side: Secondary | ICD-10-CM | POA: Diagnosis not present

## 2019-06-30 DIAGNOSIS — R42 Dizziness and giddiness: Secondary | ICD-10-CM | POA: Diagnosis not present

## 2019-07-08 ENCOUNTER — Ambulatory Visit (HOSPITAL_COMMUNITY)
Admission: RE | Admit: 2019-07-08 | Discharge: 2019-07-08 | Disposition: A | Discharge status: Home / Self Care | Payer: BC Managed Care – PPO | Source: Ambulatory Visit | Attending: Otolaryngology | Admitting: Otolaryngology

## 2019-07-08 ENCOUNTER — Other Ambulatory Visit: Payer: Self-pay

## 2019-07-08 DIAGNOSIS — H9041 Sensorineural hearing loss, unilateral, right ear, with unrestricted hearing on the contralateral side: Secondary | ICD-10-CM | POA: Insufficient documentation

## 2019-07-08 DIAGNOSIS — IMO0001 Reserved for inherently not codable concepts without codable children: Secondary | ICD-10-CM

## 2019-07-08 MED ORDER — GADOBUTROL 1 MMOL/ML IV SOLN
10.0000 mL | Freq: Once | INTRAVENOUS | Status: AC | PRN
  Administered 2019-07-08: 08:00:00 10 mL via INTRAVENOUS

## 2019-07-15 ENCOUNTER — Encounter: Payer: Self-pay | Admitting: Family Medicine

## 2019-07-15 DIAGNOSIS — H832X1 Labyrinthine dysfunction, right ear: Secondary | ICD-10-CM | POA: Diagnosis not present

## 2019-07-16 ENCOUNTER — Telehealth: Payer: Self-pay | Admitting: Family Medicine

## 2019-07-16 NOTE — Telephone Encounter (Signed)
The patient sent a MyChart message stating her blood pressure is significantly elevated Please help set her up for a office visit with me we can certainly see her this coming week thank you This would need to be in the office

## 2019-07-23 ENCOUNTER — Other Ambulatory Visit: Payer: Self-pay

## 2019-07-23 ENCOUNTER — Ambulatory Visit: Payer: BC Managed Care – PPO | Admitting: Family Medicine

## 2019-07-23 VITALS — BP 138/84 | Temp 97.6°F | Wt 193.6 lb

## 2019-07-23 DIAGNOSIS — R030 Elevated blood-pressure reading, without diagnosis of hypertension: Secondary | ICD-10-CM

## 2019-07-23 NOTE — Progress Notes (Signed)
   Subjective:    Patient ID: Jillian Ford, female    DOB: 02/19/1971, 49 y.o.   MRN: [id number]  HPI  Patient arrives to discuss elevated blood pressure at ENT. Patient states her blood pressure was 160/108 laying down and 180/110 when standing. Patient states she went to ENT blood pressure was up She states she does try to get a little bit of activity in but no purposeful exercise She also states she could do better with her diet eats on the run a lot.  As result does not get her numbers under great control.  Review of Systems  Constitutional: Negative for activity change, appetite change and fatigue.  HENT: Negative for congestion and rhinorrhea.   Respiratory: Negative for cough and shortness of breath.   Cardiovascular: Negative for chest pain and leg swelling.  Gastrointestinal: Negative for abdominal pain and diarrhea.  Endocrine: Negative for polydipsia and polyphagia.  Skin: Negative for color change.  Neurological: Negative for dizziness and weakness.  Psychiatric/Behavioral: Negative for behavioral problems and confusion.       Objective:   Physical Exam Vitals reviewed.  Constitutional:      General: She is not in acute distress. HENT:     Head: Normocephalic and atraumatic.  Eyes:     General:        Right eye: No discharge.        Left eye: No discharge.  Neck:     Trachea: No tracheal deviation.  Cardiovascular:     Rate and Rhythm: Normal rate and regular rhythm.     Heart sounds: Normal heart sounds. No murmur.  Pulmonary:     Effort: Pulmonary effort is normal. No respiratory distress.     Breath sounds: Normal breath sounds.  Lymphadenopathy:     Cervical: No cervical adenopathy.  Skin:    General: Skin is warm and dry.  Neurological:     Mental Status: She is alert.     Coordination: Coordination normal.  Psychiatric:        Behavior: Behavior normal.           Assessment & Plan:  Situational elevated blood pressure Now blood pressure  doing well No need for any type of major intervention currently I did encourage patient to exercise on a regular basis cut back on salt stay more physically active try to lose some weight and keep her blood pressure below 140/90.

## 2019-07-23 NOTE — Patient Instructions (Signed)
Stress, Adult Stress is a normal reaction to life events. Stress is what you feel when life demands more than you are used to, or more than you think you can handle. Some stress can be useful, such as studying for a test or meeting a deadline at work. Stress that occurs too often or for too long can cause problems. It can affect your emotional health and interfere with relationships and normal daily activities. Too much stress can weaken your body's defense system (immune system) and increase your risk for physical illness. If you already have a medical problem, stress can make it worse. What are the causes? All sorts of life events can cause stress. An event that causes stress for one person may not be stressful for another person. Major life events, whether positive or negative, commonly cause stress. Examples include:  Losing a job or starting a new job.  Losing a loved one.  Moving to a new town or home.  Getting married or divorced.  Having a baby.  Getting injured or sick. Less obvious life events can also cause stress, especially if they occur day after day or in combination with each other. Examples include:  Working long hours.  Driving in traffic.  Caring for children.  Being in debt.  Being in a difficult relationship. What are the signs or symptoms? Stress can cause emotional symptoms, including:  Anxiety. This is feeling worried, afraid, on edge, overwhelmed, or out of control.  Anger, including irritation or impatience.  Depression. This is feeling sad, down, helpless, or guilty.  Trouble focusing, remembering, or making decisions. Stress can cause physical symptoms, including:  Aches and pains. These may affect your head, neck, back, stomach, or other areas of your body.  Tight muscles or a clenched jaw.  Low energy.  Trouble sleeping. Stress can cause unhealthy behaviors, including:  Eating to feel better (overeating) or skipping meals.  Working too  much or putting off tasks.  Smoking, drinking alcohol, or using drugs to feel better. How is this diagnosed? Stress is diagnosed through an assessment by your health care provider. He or she may diagnose this condition based on:  Your symptoms and any stressful life events.  Your medical history.  Tests to rule out other causes of your symptoms. Depending on your condition, your health care provider may refer you to a specialist for further evaluation. How is this treated?  Stress management techniques are the recommended treatment for stress. Medicine is not typically recommended for the treatment of stress. Techniques to reduce your reaction to stressful life events include:  Stress identification. Monitor yourself for symptoms of stress and identify what causes stress for you. These skills may help you to avoid or prepare for stressful events.  Time management. Set your priorities, keep a calendar of events, and learn to say no. Taking these actions can help you avoid making too many commitments. Techniques for coping with stress include:  Rethinking the problem. Try to think realistically about stressful events rather than ignoring them or overreacting. Try to find the positives in a stressful situation rather than focusing on the negatives.  Exercise. Physical exercise can release both physical and emotional tension. The key is to find a form of exercise that you enjoy and do it regularly.  Relaxation techniques. These relax the body and mind. The key is to find one or more that you enjoy and use the techniques regularly. Examples include: ? Meditation, deep breathing, or progressive relaxation techniques. ? Yoga or   tai chi. ? Biofeedback, mindfulness techniques, or journaling. ? Listening to music, being out in nature, or participating in other hobbies.  Practicing a healthy lifestyle. Eat a balanced diet, drink plenty of water, limit or avoid caffeine, and get plenty of  sleep.  Having a strong support network. Spend time with family, friends, or other people you enjoy being around. Express your feelings and talk things over with someone you trust. Counseling or talk therapy with a mental health professional may be helpful if you are having trouble managing stress on your own. Follow these instructions at home: Lifestyle   Avoid drugs.  Do not use any products that contain nicotine or tobacco, such as cigarettes, e-cigarettes, and chewing tobacco. If you need help quitting, ask your health care provider.  Limit alcohol intake to no more than 1 drink a day for nonpregnant women and 2 drinks a day for men. One drink equals 12 oz of beer, 5 oz of wine, or 1 oz of hard liquor  Do not use alcohol or drugs to relax.  Eat a balanced diet that includes fresh fruits and vegetables, whole grains, lean meats, fish, eggs, and beans, and low-fat dairy. Avoid processed foods and foods high in added fat, sugar, and salt.  Exercise at least 30 minutes on 5 or more days each week.  Get 7-8 hours of sleep each night. General instructions   Practice stress management techniques as discussed with your health care provider.  Drink enough fluid to keep your urine clear or pale yellow.  Take over-the-counter and prescription medicines only as told by your health care provider.  Keep all follow-up visits as told by your health care provider. This is important. Contact a health care provider if:  Your symptoms get worse.  You have new symptoms.  You feel overwhelmed by your problems and can no longer manage them on your own. Get help right away if:  You have thoughts of hurting yourself or others. If you ever feel like you may hurt yourself or others, or have thoughts about taking your own life, get help right away. You can go to your nearest emergency department or call:  Your local emergency services (911 in the [place]  A suicide crisis helpline, such as the  [place] at 660-501-8800. This is open 24 hours a day. Summary  Stress is a normal reaction to life events. It can cause problems if it happens too often or for too long.  Practicing stress management techniques is the best way to treat stress.  Counseling or talk therapy with a mental health professional may be helpful if you are having trouble managing stress on your own. This information is not intended to replace advice given to you by your health care provider. Make sure you discuss any questions you have with your health care provider. Document Revised: 01/30/2019 Document Reviewed: 08/22/2016 Elsevier Patient Education  [place] DASH stands for "Dietary Approaches to Stop Hypertension." The DASH eating plan is a healthy eating plan that has been shown to reduce high blood pressure (hypertension). It may also reduce your risk for type 2 diabetes, heart disease, and stroke. The DASH eating plan may also help with weight loss. What are tips for following this plan?  General guidelines  Avoid eating more than 2,300 mg (milligrams) of salt (sodium) a day. If you have hypertension, you may need to reduce your sodium intake to 1,500 mg a day.  Limit alcohol intake  to no more than 1 drink a day for nonpregnant women and 2 drinks a day for men. One drink equals 12 oz of beer, 5 oz of wine, or 1 oz of hard liquor.  Work with your health care provider to maintain a healthy body weight or to lose weight. Ask what an ideal weight is for you.  Get at least 30 minutes of exercise that causes your heart to beat faster (aerobic exercise) most days of the week. Activities may include walking, swimming, or biking.  Work with your health care provider or diet and nutrition specialist (dietitian) to adjust your eating plan to your individual calorie needs. Reading food labels   Check food labels for the amount of sodium per serving. Choose  foods with less than 5 percent of the Daily Value of sodium. Generally, foods with less than 300 mg of sodium per serving fit into this eating plan.  To find whole grains, look for the word "whole" as the first word in the ingredient list. Shopping  Buy products labeled as "low-sodium" or "no salt added."  Buy fresh foods. Avoid canned foods and premade or frozen meals. Cooking  Avoid adding salt when cooking. Use salt-free seasonings or herbs instead of table salt or sea salt. Check with your health care provider or pharmacist before using salt substitutes.  Do not fry foods. Cook foods using healthy methods such as baking, boiling, grilling, and broiling instead.  Cook with heart-healthy oils, such as olive, canola, soybean, or sunflower oil. Meal planning  Eat a balanced diet that includes: ? 5 or more servings of fruits and vegetables each day. At each meal, try to fill half of your plate with fruits and vegetables. ? Up to 6-8 servings of whole grains each day. ? Less than 6 oz of lean meat, poultry, or fish each day. A 3-oz serving of meat is about the same size as a deck of cards. One egg equals 1 oz. ? 2 servings of low-fat dairy each day. ? A serving of nuts, seeds, or beans 5 times each week. ? Heart-healthy fats. Healthy fats called Omega-3 fatty acids are found in foods such as flaxseeds and coldwater fish, like sardines, salmon, and mackerel.  Limit how much you eat of the following: ? Canned or prepackaged foods. ? Food that is high in trans fat, such as fried foods. ? Food that is high in saturated fat, such as fatty meat. ? Sweets, desserts, sugary drinks, and other foods with added sugar. ? Full-fat dairy products.  Do not salt foods before eating.  Try to eat at least 2 vegetarian meals each week.  Eat more home-cooked food and less restaurant, buffet, and fast food.  When eating at a restaurant, ask that your food be prepared with less salt or no salt, if  possible. What foods are recommended? The items listed may not be a complete list. Talk with your dietitian about what dietary choices are best for you. Grains Whole-grain or whole-wheat bread. Whole-grain or whole-wheat pasta. Brown rice. [place]. Bulgur. Whole-grain and low-sodium cereals. Pita bread. Low-fat, low-sodium crackers. Whole-wheat flour tortillas. Vegetables Fresh or frozen vegetables (raw, steamed, roasted, or grilled). Low-sodium or reduced-sodium tomato and vegetable juice. Low-sodium or reduced-sodium tomato sauce and tomato paste. Low-sodium or reduced-sodium canned vegetables. Fruits All fresh, dried, or frozen fruit. Canned fruit in natural juice (without added sugar). Meat and other protein foods Skinless chicken or [place]. Ground chicken or [place]. Pork with fat trimmed off. Fish  and seafood. Egg whites. Dried beans, peas, or lentils. Unsalted nuts, nut butters, and seeds. Unsalted canned beans. Lean cuts of beef with fat trimmed off. Low-sodium, lean deli meat. Dairy Low-fat (1%) or fat-free (skim) milk. Fat-free, low-fat, or reduced-fat cheeses. Nonfat, low-sodium ricotta or cottage cheese. Low-fat or nonfat yogurt. Low-fat, low-sodium cheese. Fats and oils Soft margarine without trans fats. Vegetable oil. Low-fat, reduced-fat, or light mayonnaise and salad dressings (reduced-sodium). Canola, safflower, olive, soybean, and sunflower oils. Avocado. Seasoning and other foods Herbs. Spices. Seasoning mixes without salt. Unsalted popcorn and pretzels. Fat-free sweets. What foods are not recommended? The items listed may not be a complete list. Talk with your dietitian about what dietary choices are best for you. Grains Baked goods made with fat, such as croissants, muffins, or some breads. Dry pasta or rice meal packs. Vegetables Creamed or fried vegetables. Vegetables in a cheese sauce. Regular canned vegetables (not low-sodium or reduced-sodium). Regular canned  tomato sauce and paste (not low-sodium or reduced-sodium). Regular tomato and vegetable juice (not low-sodium or reduced-sodium). Angie Fava. Olives. Fruits Canned fruit in a light or heavy syrup. Fried fruit. Fruit in cream or butter sauce. Meat and other protein foods Fatty cuts of meat. Ribs. Fried meat. Berniece Salines. Sausage. Bologna and other processed lunch meats. Salami. Fatback. Hotdogs. Bratwurst. Salted nuts and seeds. Canned beans with added salt. Canned or smoked fish. Whole eggs or egg yolks. Chicken or [place] with skin. Dairy Whole or 2% milk, cream, and half-and-half. Whole or full-fat cream cheese. Whole-fat or sweetened yogurt. Full-fat cheese. Nondairy creamers. Whipped toppings. Processed cheese and cheese spreads. Fats and oils Butter. Stick margarine. Lard. Shortening. Ghee. Bacon fat. Tropical oils, such as coconut, palm kernel, or palm oil. Seasoning and other foods Salted popcorn and pretzels. Onion salt, garlic salt, seasoned salt, table salt, and sea salt. Worcestershire sauce. Tartar sauce. Barbecue sauce. Teriyaki sauce. Soy sauce, including reduced-sodium. Steak sauce. Canned and packaged gravies. Fish sauce. Oyster sauce. Cocktail sauce. Horseradish that you find on the shelf. Ketchup. Mustard. Meat flavorings and tenderizers. Bouillon cubes. Hot sauce and Tabasco sauce. Premade or packaged marinades. Premade or packaged taco seasonings. Relishes. Regular salad dressings. Where to find more information:  National Heart, Lung, and [place]: https://wilson-eaton.com/  American Heart Association: www.heart.org Summary  The DASH eating plan is a healthy eating plan that has been shown to reduce high blood pressure (hypertension). It may also reduce your risk for type 2 diabetes, heart disease, and stroke.  With the DASH eating plan, you should limit salt (sodium) intake to 2,300 mg a day. If you have hypertension, you may need to reduce your sodium intake to 1,500 mg a day.  When  on the DASH eating plan, aim to eat more fresh fruits and vegetables, whole grains, lean proteins, low-fat dairy, and heart-healthy fats.  Work with your health care provider or diet and nutrition specialist (dietitian) to adjust your eating plan to your individual calorie needs. This information is not intended to replace advice given to you by your health care provider. Make sure you discuss any questions you have with your health care provider. Document Revised: 06/14/2017 Document Reviewed: 06/25/2016 Elsevier Patient Education  2020 [employer].

## 2019-10-26 ENCOUNTER — Encounter: Payer: Self-pay | Admitting: Family Medicine

## 2019-10-26 MED ORDER — VALACYCLOVIR HCL 1 G PO TABS: ORAL_TABLET | ORAL | 5 refills | Status: DC

## 2019-10-26 MED ORDER — ACYCLOVIR 400 MG PO TABS: 400.0000 mg | ORAL_TABLET | Freq: Two times a day (BID) | ORAL | 5 refills | Status: DC

## 2019-10-26 NOTE — Addendum Note (Signed)
Addended by: Marlowe Shores on: 10/26/2019 11:21 AM   Modules accepted: Orders

## 2019-10-26 NOTE — Telephone Encounter (Signed)
Nurses May have refill of acyclovir which works well to suppress the development of cold sores, may have 37-month refills  But as for this flareup I would also recommend Valtrex 1000 mg, take 2 pills now and repeat 2 pills in 12 hours, #4 with 5 refills  Valtrex generic works excellent at treating a flareup So she should use the Valtrex today to treat the flareup And restart the acyclovir on a daily basis to suppress cold sores from coming  Whereas the acyclovir is more so to suppress the development and is more intended on a daily basis

## 2019-11-17 ENCOUNTER — Telehealth: Payer: Self-pay | Admitting: Family Medicine

## 2019-11-17 NOTE — Telephone Encounter (Signed)
Patient started with a runny nose about 2 weeks ago which has just turned into head congestion which she relates to allergies. No other symptoms, no fever. Patient has not yet received covid vaccine. Patient is not against doing virtual visit but this appointment was to recheck bp and weight -- should she reschedule for a couple weeks? According to scale at home pt has lost 10-15lbs since last visit.

## 2019-11-17 NOTE — Telephone Encounter (Signed)
Prescreened pt for tomorrows appt. She is having some congestion been going on since last week sometime. Is pt ok to come in office?

## 2019-11-17 NOTE — Telephone Encounter (Signed)
It would be fine for the patient to reschedule either to next week or the week of the 24th ( I will be out some in May)

## 2019-11-17 NOTE — Telephone Encounter (Signed)
Pt rescheduled

## 2019-11-17 NOTE — Telephone Encounter (Signed)
Nurses please talk with patient find out specifically how long is the patient having congestion?  Any other symptoms?  Any fevers etc.?  Please explain to the patient our predicament regarding trying to keep a safe environment for all patients who come in and that is the reason for all these questions. Also is a patient currently vaccinated against Covid? It is possible we will need to reschedule the patient or have her come in toward the end of the afternoon or do a virtual Find out this information then we can go from there

## 2019-11-18 ENCOUNTER — Ambulatory Visit: Payer: BC Managed Care – PPO | Admitting: Family Medicine

## 2019-11-24 ENCOUNTER — Other Ambulatory Visit: Payer: Self-pay

## 2019-11-24 ENCOUNTER — Encounter: Payer: Self-pay | Admitting: Family Medicine

## 2019-11-24 ENCOUNTER — Ambulatory Visit: Payer: BC Managed Care – PPO | Admitting: Family Medicine

## 2019-11-24 VITALS — BP 138/88 | Temp 97.3°F | Wt 179.4 lb

## 2019-11-24 DIAGNOSIS — R030 Elevated blood-pressure reading, without diagnosis of hypertension: Secondary | ICD-10-CM | POA: Diagnosis not present

## 2019-11-24 NOTE — Progress Notes (Signed)
   Subjective:    Patient ID: Jillian Ford, female    DOB: 05-25-1971, 49 y.o.   MRN: [id number]  HPI Patient comes in today to follow up on a high blood pressure reading at ENT office about 4 months ago.  Does not watch her diet as best she could she does try to stay physically active at work but does not do much in the way of walking does not smoke does use more salt than she should  Review of Systems  Constitutional: Negative for activity change, appetite change and fatigue.  HENT: Negative for congestion and rhinorrhea.   Respiratory: Negative for cough and shortness of breath.   Cardiovascular: Negative for chest pain and leg swelling.  Gastrointestinal: Negative for abdominal pain and diarrhea.  Endocrine: Negative for polydipsia and polyphagia.  Skin: Negative for color change.  Neurological: Negative for dizziness and weakness.  Psychiatric/Behavioral: Negative for behavioral problems and confusion.       Objective:   Physical Exam Vitals reviewed.  Constitutional:      General: She is not in acute distress. HENT:     Head: Normocephalic and atraumatic.  Eyes:     General:        Right eye: No discharge.        Left eye: No discharge.  Neck:     Trachea: No tracheal deviation.  Cardiovascular:     Rate and Rhythm: Normal rate and regular rhythm.     Heart sounds: Normal heart sounds. No murmur.  Pulmonary:     Effort: Pulmonary effort is normal. No respiratory distress.     Breath sounds: Normal breath sounds.  Lymphadenopathy:     Cervical: No cervical adenopathy.  Skin:    General: Skin is warm and dry.  Neurological:     Mental Status: She is alert.     Coordination: Coordination normal.  Psychiatric:        Behavior: Behavior normal.           Assessment & Plan:  Blood pressure is borderline Very important for patient doing walking on a regular basis Patient does not feel patient will do follow-up visit in 4 months to recheck blood  pressure DASHDiet information given.

## 2019-11-24 NOTE — Patient Instructions (Signed)
DASH Eating Plan DASH stands for "Dietary Approaches to Stop Hypertension." The DASH eating plan is a healthy eating plan that has been shown to reduce high blood pressure (hypertension). It may also reduce your risk for type 2 diabetes, heart disease, and stroke. The DASH eating plan may also help with weight loss. What are tips for following this plan?  General guidelines  Avoid eating more than 2,300 mg (milligrams) of salt (sodium) a day. If you have hypertension, you may need to reduce your sodium intake to 1,500 mg a day.  Limit alcohol intake to no more than 1 drink a day for nonpregnant women and 2 drinks a day for men. One drink equals 12 oz of beer, 5 oz of wine, or 1 oz of hard liquor.  Work with your health care provider to maintain a healthy body weight or to lose weight. Ask what an ideal weight is for you.  Get at least 30 minutes of exercise that causes your heart to beat faster (aerobic exercise) most days of the week. Activities may include walking, swimming, or biking.  Work with your health care provider or diet and nutrition specialist (dietitian) to adjust your eating plan to your individual calorie needs. Reading food labels   Check food labels for the amount of sodium per serving. Choose foods with less than 5 percent of the Daily Value of sodium. Generally, foods with less than 300 mg of sodium per serving fit into this eating plan.  To find whole grains, look for the word "whole" as the first word in the ingredient list. Shopping  Buy products labeled as "low-sodium" or "no salt added."  Buy fresh foods. Avoid canned foods and premade or frozen meals. Cooking  Avoid adding salt when cooking. Use salt-free seasonings or herbs instead of table salt or sea salt. Check with your health care provider or pharmacist before using salt substitutes.  Do not fry foods. Cook foods using healthy methods such as baking, boiling, grilling, and broiling instead.  Cook with  heart-healthy oils, such as olive, canola, soybean, or sunflower oil. Meal planning  Eat a balanced diet that includes: ? 5 or more servings of fruits and vegetables each day. At each meal, try to fill half of your plate with fruits and vegetables. ? Up to 6-8 servings of whole grains each day. ? Less than 6 oz of lean meat, poultry, or fish each day. A 3-oz serving of meat is about the same size as a deck of cards. One egg equals 1 oz. ? 2 servings of low-fat dairy each day. ? A serving of nuts, seeds, or beans 5 times each week. ? Heart-healthy fats. Healthy fats called Omega-3 fatty acids are found in foods such as flaxseeds and coldwater fish, like sardines, salmon, and mackerel.  Limit how much you eat of the following: ? Canned or prepackaged foods. ? Food that is high in trans fat, such as fried foods. ? Food that is high in saturated fat, such as fatty meat. ? Sweets, desserts, sugary drinks, and other foods with added sugar. ? Full-fat dairy products.  Do not salt foods before eating.  Try to eat at least 2 vegetarian meals each week.  Eat more home-cooked food and less restaurant, buffet, and fast food.  When eating at a restaurant, ask that your food be prepared with less salt or no salt, if possible. What foods are recommended? The items listed may not be a complete list. Talk with your dietitian about   what dietary choices are best for you. Grains Whole-grain or whole-wheat bread. Whole-grain or whole-wheat pasta. Brown rice. Oatmeal. Quinoa. Bulgur. Whole-grain and low-sodium cereals. Pita bread. Low-fat, low-sodium crackers. Whole-wheat flour tortillas. Vegetables Fresh or frozen vegetables (raw, steamed, roasted, or grilled). Low-sodium or reduced-sodium tomato and vegetable juice. Low-sodium or reduced-sodium tomato sauce and tomato paste. Low-sodium or reduced-sodium canned vegetables. Fruits All fresh, dried, or frozen fruit. Canned fruit in natural juice (without  added sugar). Meat and other protein foods Skinless chicken or turkey. Ground chicken or turkey. Pork with fat trimmed off. Fish and seafood. Egg whites. Dried beans, peas, or lentils. Unsalted nuts, nut butters, and seeds. Unsalted canned beans. Lean cuts of beef with fat trimmed off. Low-sodium, lean deli meat. Dairy Low-fat (1%) or fat-free (skim) milk. Fat-free, low-fat, or reduced-fat cheeses. Nonfat, low-sodium ricotta or cottage cheese. Low-fat or nonfat yogurt. Low-fat, low-sodium cheese. Fats and oils Soft margarine without trans fats. Vegetable oil. Low-fat, reduced-fat, or light mayonnaise and salad dressings (reduced-sodium). Canola, safflower, olive, soybean, and sunflower oils. Avocado. Seasoning and other foods Herbs. Spices. Seasoning mixes without salt. Unsalted popcorn and pretzels. Fat-free sweets. What foods are not recommended? The items listed may not be a complete list. Talk with your dietitian about what dietary choices are best for you. Grains Baked goods made with fat, such as croissants, muffins, or some breads. Dry pasta or rice meal packs. Vegetables Creamed or fried vegetables. Vegetables in a cheese sauce. Regular canned vegetables (not low-sodium or reduced-sodium). Regular canned tomato sauce and paste (not low-sodium or reduced-sodium). Regular tomato and vegetable juice (not low-sodium or reduced-sodium). Pickles. Olives. Fruits Canned fruit in a light or heavy syrup. Fried fruit. Fruit in cream or butter sauce. Meat and other protein foods Fatty cuts of meat. Ribs. Fried meat. Bacon. Sausage. Bologna and other processed lunch meats. Salami. Fatback. Hotdogs. Bratwurst. Salted nuts and seeds. Canned beans with added salt. Canned or smoked fish. Whole eggs or egg yolks. Chicken or turkey with skin. Dairy Whole or 2% milk, cream, and half-and-half. Whole or full-fat cream cheese. Whole-fat or sweetened yogurt. Full-fat cheese. Nondairy creamers. Whipped toppings.  Processed cheese and cheese spreads. Fats and oils Butter. Stick margarine. Lard. Shortening. Ghee. Bacon fat. Tropical oils, such as coconut, palm kernel, or palm oil. Seasoning and other foods Salted popcorn and pretzels. Onion salt, garlic salt, seasoned salt, table salt, and sea salt. Worcestershire sauce. Tartar sauce. Barbecue sauce. Teriyaki sauce. Soy sauce, including reduced-sodium. Steak sauce. Canned and packaged gravies. Fish sauce. Oyster sauce. Cocktail sauce. Horseradish that you find on the shelf. Ketchup. Mustard. Meat flavorings and tenderizers. Bouillon cubes. Hot sauce and Tabasco sauce. Premade or packaged marinades. Premade or packaged taco seasonings. Relishes. Regular salad dressings. Where to find more information:  National Heart, Lung, and Blood Institute: www.nhlbi.nih.gov  American Heart Association: www.heart.org Summary  The DASH eating plan is a healthy eating plan that has been shown to reduce high blood pressure (hypertension). It may also reduce your risk for type 2 diabetes, heart disease, and stroke.  With the DASH eating plan, you should limit salt (sodium) intake to 2,300 mg a day. If you have hypertension, you may need to reduce your sodium intake to 1,500 mg a day.  When on the DASH eating plan, aim to eat more fresh fruits and vegetables, whole grains, lean proteins, low-fat dairy, and heart-healthy fats.  Work with your health care provider or diet and nutrition specialist (dietitian) to adjust your eating plan to your   individual calorie needs. This information is not intended to replace advice given to you by your health care provider. Make sure you discuss any questions you have with your health care provider. Document Revised: 06/14/2017 Document Reviewed: 06/25/2016 Elsevier Patient Education  2020 Elsevier Inc.  

## 2020-03-02 ENCOUNTER — Other Ambulatory Visit: Payer: Self-pay | Admitting: Family Medicine

## 2020-03-15 ENCOUNTER — Ambulatory Visit: Payer: BC Managed Care – PPO | Admitting: Nurse Practitioner

## 2020-03-15 ENCOUNTER — Other Ambulatory Visit: Payer: Self-pay

## 2020-03-15 VITALS — BP 138/88 | HR 106 | Temp 97.5°F | Ht 63.0 in | Wt 183.0 lb

## 2020-03-15 DIAGNOSIS — Z1231 Encounter for screening mammogram for malignant neoplasm of breast: Secondary | ICD-10-CM

## 2020-03-15 DIAGNOSIS — Z0000 Encounter for general adult medical examination without abnormal findings: Secondary | ICD-10-CM

## 2020-03-15 DIAGNOSIS — Z13220 Encounter for screening for lipoid disorders: Secondary | ICD-10-CM

## 2020-03-15 DIAGNOSIS — Z79899 Other long term (current) drug therapy: Secondary | ICD-10-CM | POA: Diagnosis not present

## 2020-03-15 DIAGNOSIS — Z1329 Encounter for screening for other suspected endocrine disorder: Secondary | ICD-10-CM

## 2020-03-15 DIAGNOSIS — F418 Other specified anxiety disorders: Secondary | ICD-10-CM | POA: Diagnosis not present

## 2020-03-15 DIAGNOSIS — R5383 Other fatigue: Secondary | ICD-10-CM

## 2020-03-15 DIAGNOSIS — Z01419 Encounter for gynecological examination (general) (routine) without abnormal findings: Secondary | ICD-10-CM

## 2020-03-15 MED ORDER — DULOXETINE HCL 30 MG PO CPEP: 30.0000 mg | ORAL_CAPSULE | Freq: Every day | ORAL | 2 refills | Status: DC

## 2020-03-15 NOTE — Progress Notes (Signed)
The patient comes in today for a wellness visit.    Subjective:    Patient ID: Jillian Ford, female    DOB: 1970/11/28, 49 y.o.   MRN: [id number]  HPI A review of their health history was completed.  A review of medications was also completed.  Any needed refills; none at this time  Eating habits: eats terrible; does good then falls off the wagon   Falls/  MVA accidents in past few months: none  Regular exercise: not exercising regular  Specialist pt sees on regular basis: ENT  Preventative health issues were discussed.   Additional concerns: anxiety med; abdominal always feels bloated   Depression screen [facility] 2/9 03/15/2020 06/10/2017  Decreased Interest 1 1  Down, Depressed, Hopeless 1 1  PHQ - 2 Score 2 2  Altered sleeping 3 -  Tired, decreased energy 3 -  Change in appetite 1 -  Feeling bad or failure about yourself  1 -  Trouble concentrating 3 -  Moving slowly or fidgety/restless 0 -  Suicidal thoughts 0 -  PHQ-9 Score 13 -  Has a history of depression with anxiety. Has noticed more symptoms lately. No suicidal or homicidal thoughts or ideation.  Occasional missed pill. Very light cycles lasting about 2 days. Has BP monitor at home but has not been checking BP lately. Same sexual partner.  Regular vision and dental exams.   Review of Systems  Constitutional: Negative for activity change, appetite change and fatigue.  Respiratory: Negative for cough, chest tightness, shortness of breath and wheezing.   Cardiovascular: Negative for chest pain.  Gastrointestinal: Negative for abdominal distention, abdominal pain, blood in stool, constipation, diarrhea, nausea and vomiting.  Genitourinary: Negative for difficulty urinating, dysuria, enuresis, frequency, genital sores, menstrual problem, pelvic pain, urgency and vaginal discharge.       Objective:   Physical Exam Constitutional:      General: She is not in acute distress.    Appearance: She is well-developed.   Neck:     Thyroid: No thyromegaly.     Trachea: No tracheal deviation.     Comments: Thyroid non tender. No mass or goiter noted.  Cardiovascular:     Rate and Rhythm: Normal rate and regular rhythm.     Heart sounds: No murmur heard.  No gallop.   Pulmonary:     Effort: Pulmonary effort is normal.     Breath sounds: Normal breath sounds.  Chest:     Breasts:        Right: No swelling, inverted nipple, mass, skin change or tenderness.        Left: No swelling, inverted nipple, mass, skin change or tenderness.  Abdominal:     General: There is no distension.     Palpations: Abdomen is soft. There is no mass.     Tenderness: There is no abdominal tenderness.  Genitourinary:    Vagina: Normal. No vaginal discharge.     Comments: External GU: no rash or lesion. Vagina: no discharge. Cervix normal in appearance. No CMT. Bimanual exam: no tenderness or obvious masses.  Musculoskeletal:     Cervical back: Normal range of motion and neck supple.  Lymphadenopathy:     Cervical: No cervical adenopathy.     Upper Body:     Right upper body: No supraclavicular, axillary or pectoral adenopathy.     Left upper body: No supraclavicular, axillary or pectoral adenopathy.  Skin:    General: Skin is warm and dry.  Findings: No rash.  Neurological:     Mental Status: She is alert and oriented to person, place, and time.  Psychiatric:        Mood and Affect: Mood normal.        Behavior: Behavior normal.        Thought Content: Thought content normal.        Judgment: Judgment normal.           Assessment & Plan:   Problem List Items Addressed This Visit      Other   Depression with anxiety   Relevant Medications   DULoxetine (CYMBALTA) 30 MG capsule    Other Visit Diagnoses    Well woman exam with routine gynecological exam    -  Primary   Relevant Orders   CBC with Differential   Comprehensive Metabolic Panel (CMET)   Lipid Profile   TSH   Hepatitis C Antibody   HIV  antibody (with reflex)   Fatigue, unspecified type       Relevant Orders   CBC with Differential   Comprehensive Metabolic Panel (CMET)   Lipid Profile   TSH   Hepatitis C Antibody   HIV antibody (with reflex)   High risk medication use       Relevant Orders   CBC with Differential   Comprehensive Metabolic Panel (CMET)   Lipid Profile   TSH   Hepatitis C Antibody   HIV antibody (with reflex)   Screening, lipid       Relevant Orders   CBC with Differential   Comprehensive Metabolic Panel (CMET)   Lipid Profile   TSH   Hepatitis C Antibody   HIV antibody (with reflex)   Screening for thyroid disorder       Relevant Orders   CBC with Differential   Comprehensive Metabolic Panel (CMET)   Lipid Profile   TSH   Hepatitis C Antibody   HIV antibody (with reflex)   Screening mammogram, encounter for       Relevant Orders   MM DIGITAL SCREENING BILATERAL      Labs pending. Discussed risks associated with use of estrogen including risk of PE/DVT and cancer. Encouraged patient to consider other contraceptives such as: Continue pill (consider progesterone only pill) IUD (Mirena) Nexplanon Recommend flu vaccine this fall. Meds ordered this encounter  Medications   DULoxetine (CYMBALTA) 30 MG capsule    Sig: Take 1 capsule (30 mg total) by mouth daily.    Dispense:  30 capsule    Refill:  2    Order Specific Question:   Supervising Provider    Answer:   Lilyan Punt A [[id number]]   Restart Cymbalta for depression. Also discussed element of possible seasonal affective disorder. Call back in one month if no improvement, sooner if any problems.  Return in about 3 months (around 06/14/2020) for Recheck depression/anxiety.

## 2020-03-15 NOTE — Patient Instructions (Signed)
Continue pill (consider progesterone only pill) IUD (Mirena) Nexplanon

## 2020-03-18 ENCOUNTER — Encounter: Payer: Self-pay | Admitting: Nurse Practitioner

## 2020-03-18 DIAGNOSIS — F418 Other specified anxiety disorders: Secondary | ICD-10-CM | POA: Insufficient documentation

## 2020-03-30 ENCOUNTER — Ambulatory Visit (HOSPITAL_COMMUNITY): Payer: BC Managed Care – PPO

## 2020-04-04 ENCOUNTER — Other Ambulatory Visit: Payer: Self-pay

## 2020-04-04 ENCOUNTER — Ambulatory Visit (HOSPITAL_COMMUNITY)
Admission: RE | Admit: 2020-04-04 | Discharge: 2020-04-04 | Disposition: A | Discharge status: Home / Self Care | Payer: BC Managed Care – PPO | Source: Ambulatory Visit | Attending: Nurse Practitioner | Admitting: Nurse Practitioner

## 2020-04-04 DIAGNOSIS — Z1231 Encounter for screening mammogram for malignant neoplasm of breast: Secondary | ICD-10-CM | POA: Insufficient documentation

## 2020-05-23 ENCOUNTER — Encounter: Payer: Self-pay | Admitting: Family Medicine

## 2020-05-23 NOTE — Telephone Encounter (Signed)
Nurses Although allergies can cause sneezing other things can cause sneezing too.  Unfortunately even Covid can cause sneezing  I would recommend that Jillian Ford get tested for Covid.  This could be done through pharmacy or our office or testing center technically when she does a test for this she should stay away from work until she knows the results.  Thank you-Dr. Lorin Picket  This approach is recommended to be thorough and on the safe side.  As for allergies I recommend Flonase 2 sprays each nostril daily in 1 container, 3 refills may send this to the pharmacy She may also use OTC loratadine or cetirizine as needed

## 2020-05-25 ENCOUNTER — Other Ambulatory Visit: Payer: Self-pay | Admitting: Family Medicine

## 2020-06-13 DIAGNOSIS — Z13220 Encounter for screening for lipoid disorders: Secondary | ICD-10-CM | POA: Diagnosis not present

## 2020-06-13 DIAGNOSIS — R5383 Other fatigue: Secondary | ICD-10-CM | POA: Diagnosis not present

## 2020-06-13 DIAGNOSIS — D45 Polycythemia vera: Secondary | ICD-10-CM | POA: Diagnosis not present

## 2020-06-13 DIAGNOSIS — Z01419 Encounter for gynecological examination (general) (routine) without abnormal findings: Secondary | ICD-10-CM | POA: Diagnosis not present

## 2020-06-13 DIAGNOSIS — Z79899 Other long term (current) drug therapy: Secondary | ICD-10-CM | POA: Diagnosis not present

## 2020-06-14 ENCOUNTER — Ambulatory Visit: Payer: BC Managed Care – PPO | Admitting: Family Medicine

## 2020-06-14 VITALS — BP 126/84 | Temp 98.8°F | Ht 63.0 in | Wt 195.8 lb

## 2020-06-14 DIAGNOSIS — D751 Secondary polycythemia: Secondary | ICD-10-CM | POA: Diagnosis not present

## 2020-06-14 DIAGNOSIS — F418 Other specified anxiety disorders: Secondary | ICD-10-CM | POA: Diagnosis not present

## 2020-06-14 LAB — TSH: TSH: 1.91 u[IU]/mL (ref 0.450–4.500)

## 2020-06-14 LAB — COMPREHENSIVE METABOLIC PANEL
ALT: 11 IU/L (ref 0–32)
AST: 9 IU/L (ref 0–40)
Albumin/Globulin Ratio: 1.5 (ref 1.2–2.2)
Albumin: 4.2 g/dL (ref 3.8–4.8)
Alkaline Phosphatase: 81 IU/L (ref 44–121)
BUN/Creatinine Ratio: 12 (ref 9–23)
BUN: 9 mg/dL (ref 6–24)
Bilirubin Total: 0.4 mg/dL (ref 0.0–1.2)
CO2: 20 mmol/L (ref 20–29)
Calcium: 9.2 mg/dL (ref 8.7–10.2)
Chloride: 103 mmol/L (ref 96–106)
Creatinine, Ser: 0.77 mg/dL (ref 0.57–1.00)
GFR calc Af Amer: 105 mL/min/{1.73_m2} (ref >59)
GFR calc non Af Amer: 91 mL/min/{1.73_m2} (ref >59)
Globulin, Total: 2.8 g/dL (ref 1.5–4.5)
Glucose: 93 mg/dL (ref 65–99)
Potassium: 4 mmol/L (ref 3.5–5.2)
Sodium: 140 mmol/L (ref 134–144)
Total Protein: 7 g/dL (ref 6.0–8.5)

## 2020-06-14 LAB — LIPID PANEL
Chol/HDL Ratio: 1.6 ratio (ref 0.0–4.4)
Cholesterol, Total: 120 mg/dL (ref 100–199)
HDL: 73 mg/dL (ref >39)
LDL Chol Calc (NIH): 33 mg/dL (ref 0–99)
Triglycerides: 63 mg/dL (ref 0–149)
VLDL Cholesterol Cal: 14 mg/dL (ref 5–40)

## 2020-06-14 LAB — CBC WITH DIFFERENTIAL/PLATELET
Basophils Absolute: 0.1 10*3/uL (ref 0.0–0.2)
Basos: 1 %
EOS (ABSOLUTE): 0.4 10*3/uL (ref 0.0–0.4)
Eos: 3 %
Hematocrit: 47.7 % — ABNORMAL HIGH (ref 34.0–46.6)
Hemoglobin: 16.6 g/dL — ABNORMAL HIGH (ref 11.1–15.9)
Immature Grans (Abs): 0.1 10*3/uL (ref 0.0–0.1)
Immature Granulocytes: 1 %
Lymphocytes Absolute: 2.3 10*3/uL (ref 0.7–3.1)
Lymphs: 21 %
MCH: 31.3 pg (ref 26.6–33.0)
MCHC: 34.8 g/dL (ref 31.5–35.7)
MCV: 90 fL (ref 79–97)
Monocytes Absolute: 0.5 10*3/uL (ref 0.1–0.9)
Monocytes: 5 %
Neutrophils Absolute: 7.4 10*3/uL — ABNORMAL HIGH (ref 1.4–7.0)
Neutrophils: 69 %
Platelets: 413 10*3/uL (ref 150–450)
RBC: 5.3 x10E6/uL — ABNORMAL HIGH (ref 3.77–5.28)
RDW: 12.2 % (ref 11.7–15.4)
WBC: 10.6 10*3/uL (ref 3.4–10.8)

## 2020-06-14 LAB — HIV ANTIBODY (ROUTINE TESTING W REFLEX): HIV Screen 4th Generation wRfx: NONREACTIVE

## 2020-06-14 LAB — HEPATITIS C ANTIBODY: Hep C Virus Ab: 0.1 s/co ratio (ref 0.0–0.9)

## 2020-06-14 MED ORDER — SERTRALINE HCL 50 MG PO TABS: 50.0000 mg | ORAL_TABLET | Freq: Every day | ORAL | 3 refills | Status: DC

## 2020-06-14 NOTE — Patient Instructions (Signed)
Sertraline First week 1/2 tablet per day' Then 1 a day Send me a mychart in 10 to 14 days thanks

## 2020-06-14 NOTE — Progress Notes (Signed)
   Subjective:    Patient ID: Jillian Ford, female    DOB: [birth year], 49 y.o.   MRN: [id number]  HPI Very nice patient Under a lot of stress Her job may be moved to [place] which would make it difficult for her to commute back and forth.  She is frustrated by this.  Finds her self at times feeling stressed anxious on edge nervous not suicidal not homicidal at times does have depression issues finding her self feeling down negative about herself has gained weight recently through stress eating Patient arrives for a follow up on depression and anxiety. Patient states she was unable to take Cymbalta due to side effects  Review of Systems  Constitutional: Negative for activity change, appetite change and fatigue.  HENT: Negative for congestion and rhinorrhea.   Respiratory: Negative for cough and shortness of breath.   Cardiovascular: Negative for chest pain and leg swelling.  Gastrointestinal: Negative for abdominal pain, nausea and vomiting.  Skin: Negative for color change.  Neurological: Negative for dizziness, weakness and headaches.  Psychiatric/Behavioral: Negative for agitation, behavioral problems and confusion.       Objective:   Physical Exam Vitals reviewed.  Constitutional:      General: She is not in acute distress. HENT:     Head: Normocephalic.  Cardiovascular:     Rate and Rhythm: Normal rate and regular rhythm.     Heart sounds: Normal heart sounds. No murmur heard.   Pulmonary:     Effort: Pulmonary effort is normal.     Breath sounds: Normal breath sounds.  Lymphadenopathy:     Cervical: No cervical adenopathy.  Neurological:     Mental Status: She is alert.  Psychiatric:        Behavior: Behavior normal.    Lab work was reviewed with the patient       Assessment & Plan:  1. Polycythemia Will order iron test. At this point time does not need to see hematology.  Await results of additional testing 2. Depression with anxiety Sertraline 50 mg  half tablet on a daily basis for the next 7 days then 1 tablet thereafter Pathophysiology as well as how the medication works was discussed with the patient Continue current measures Patient to give [place] update within 2 weeks time how she is doing Follow-up in 6 weeks Patient not suicidal

## 2020-06-21 LAB — SPECIMEN STATUS REPORT

## 2020-06-22 LAB — IRON: Iron: 102 ug/dL (ref 27–159)

## 2020-06-22 LAB — FERRITIN: Ferritin: 122 ng/mL (ref 15–150)

## 2020-06-22 LAB — SPECIMEN STATUS REPORT

## 2020-07-03 ENCOUNTER — Ambulatory Visit
Admission: RE | Admit: 2020-07-03 | Discharge: 2020-07-03 | Disposition: A | Discharge status: Home / Self Care | Payer: BC Managed Care – PPO | Source: Ambulatory Visit | Attending: Emergency Medicine | Admitting: Emergency Medicine

## 2020-07-03 ENCOUNTER — Other Ambulatory Visit: Payer: Self-pay

## 2020-07-03 VITALS — BP 150/88 | HR 120 | Temp 99.4°F | Resp 15

## 2020-07-03 DIAGNOSIS — J069 Acute upper respiratory infection, unspecified: Secondary | ICD-10-CM | POA: Diagnosis not present

## 2020-07-03 DIAGNOSIS — Z1152 Encounter for screening for COVID-19: Secondary | ICD-10-CM | POA: Diagnosis not present

## 2020-07-03 DIAGNOSIS — R509 Fever, unspecified: Secondary | ICD-10-CM

## 2020-07-03 DIAGNOSIS — J029 Acute pharyngitis, unspecified: Secondary | ICD-10-CM | POA: Diagnosis not present

## 2020-07-03 MED ORDER — BENZONATATE 100 MG PO CAPS: 100.0000 mg | ORAL_CAPSULE | Freq: Three times a day (TID) | ORAL | 0 refills | Status: DC | PRN

## 2020-07-03 MED ORDER — CETIRIZINE HCL 10 MG PO TABS: 10.0000 mg | ORAL_TABLET | Freq: Every day | ORAL | 0 refills | Status: DC

## 2020-07-03 MED ORDER — DEXAMETHASONE 4 MG PO TABS: 4.0000 mg | ORAL_TABLET | Freq: Every day | ORAL | 0 refills | Status: AC

## 2020-07-03 MED ORDER — FLUTICASONE PROPIONATE 50 MCG/ACT NA SUSP: 1.0000 | Freq: Every day | NASAL | 0 refills | Status: DC

## 2020-07-03 NOTE — Discharge Instructions (Addendum)
COVID-19, flu A/B testing ordered.  It will take between 2-7 days for test results.  Someone will contact you regarding abnormal results.    In the meantime: You should remain isolated in your home for 10 days from symptom onset AND greater than 24 hours after symptoms resolution (absence of fever without the use of fever-reducing medication and improvement in respiratory symptoms), whichever is longer Get plenty of rest and push fluids Tessalon Perles prescribed for cough Zyrtec-D prescribed for nasal congestion, runny nose, and/or sore throat Flonase prescribed for nasal congestion and runny nose Decadron was prescribed Use OTC throat lozenges such as Halls, Vicks or Cepacol to sore throat Use OTC medications like ibuprofen or tylenol as needed fever or pain Call or go to the ED if you have any new or worsening symptoms such as fever, worsening cough, shortness of breath, chest tightness, chest pain, turning blue, changes in mental status, etc..

## 2020-07-03 NOTE — ED Provider Notes (Signed)
[facility]   [id number] 07/03/20 Arrival Time: 1131  [id number] THROAT  SUBJECTIVE: History from: patient and family.  Jillian Ford is a 49 y.o. female who presents to the urgent care for complaint of sore throat, cough, nasal congestion for the past 3 days.  Denies sick exposure to strep, flu or mono, or precipitating event.  Has tried OTC medication without relief.  Symptoms are made worse with swallowing, but tolerating liquids and own secretions without difficulty.  Denies previous symptoms in the past.  Denies fever, chills, fatigue, ear pain, sinus pain, rhinorrhea, SOB, wheezing, chest pain, nausea, rash, changes in bowel or bladder habits.    ROS: As per HPI.  All other pertinent ROS negative.      History reviewed. No pertinent past medical history. History reviewed. No pertinent surgical history. Allergies  Allergen Reactions  . Wellbutrin [Bupropion] Itching and Rash   No current facility-administered medications on file prior to encounter.   Current Outpatient Medications on File Prior to Encounter  Medication Sig Dispense Refill  . FALMINA 0.1-20 MG-MCG tablet TAKE ONE (1) TABLET BY MOUTH EVERY DAY AS DIRECTED 84 tablet 1  . sertraline (ZOLOFT) 50 MG tablet Take 1 tablet (50 mg total) by mouth daily. 30 tablet 3   Social History   Socioeconomic History  . Marital status: Married    Spouse name: Not on file  . Number of children: Not on file  . Years of education: Not on file  . Highest education level: Not on file  Occupational History  . Not on file  Tobacco Use  . Smoking status: Never Smoker  . Smokeless tobacco: Never Used  Substance and Sexual Activity  . Alcohol use: Yes    Comment: occas social  . Drug use: No  . Sexual activity: Yes    Birth control/protection: Pill  Other Topics Concern  . Not on file  Social History Narrative  . Not on file   Social Determinants of Health   Financial Resource Strain: Not on file  Food  Insecurity: Not on file  Transportation Needs: Not on file  Physical Activity: Not on file  Stress: Not on file  Social Connections: Not on file  Intimate Partner Violence: Not on file   No family history on file.  OBJECTIVE:  Vitals:   07/03/20 1149  BP: (!) 150/88  Pulse: (!) 120  Resp: 15  Temp: 99.4 F (37.4 C)  SpO2: 97%     General appearance: alert; appears fatigued, but nontoxic, speaking in full sentences and managing own secretions HEENT: NCAT; Ears: EACs clear, TMs pearly gray with visible cone of light, without erythema; Eyes: PERRL, EOMI grossly; Nose: no obvious rhinorrhea; Throat: oropharynx clear, tonsils 1+ and mildly erythematous without white tonsillar exudates, uvula midline Neck: supple without LAD Lungs: CTA bilaterally without adventitious breath sounds; cough absent Heart: regular rate and rhythm.  Radial pulses 2+ symmetrical bilaterally Skin: warm and dry Psychological: alert and cooperative; normal mood and affect  LABS: No results found for this or any previous visit (from the past 24 hour(s)).   ASSESSMENT & PLAN:  1. Sore throat   2. Encounter for screening for COVID-19   3. URI with cough and congestion   4. Fever, unknown origin     Meds ordered this encounter  Medications  . cetirizine (ZYRTEC ALLERGY) 10 MG tablet    Sig: Take 1 tablet (10 mg total) by mouth daily.    Dispense:  30 tablet  Refill:  0  . benzonatate (TESSALON) 100 MG capsule    Sig: Take 1 capsule (100 mg total) by mouth 3 (three) times daily as needed for cough.    Dispense:  30 capsule    Refill:  0  . fluticasone (FLONASE) 50 MCG/ACT nasal spray    Sig: Place 1 spray into both nostrils daily for 14 days.    Dispense:  16 g    Refill:  0  . dexamethasone (DECADRON) 4 MG tablet    Sig: Take 1 tablet (4 mg total) by mouth daily for 7 days.    Dispense:  7 tablet    Refill:  0    Discharge instructions   COVID-19, flu A/B testing ordered.  It will take  between 2-7 days for test results.  Someone will contact you regarding abnormal results.    In the meantime: You should remain isolated in your home for 10 days from symptom onset AND greater than 24 hours after symptoms resolution (absence of fever without the use of fever-reducing medication and improvement in respiratory symptoms), whichever is longer Get plenty of rest and push fluids Tessalon Perles prescribed for cough Zyrtec-D prescribed for nasal congestion, runny nose, and/or sore throat Flonase prescribed for nasal congestion and runny nose Decadron was prescribed Use OTC throat lozenges such as Halls, Vicks or Cepacol to sore throat Use OTC medications like ibuprofen or tylenol as needed fever or pain Call or go to the ED if you have any new or worsening symptoms such as fever, worsening cough, shortness of breath, chest tightness, chest pain, turning blue, changes in mental status, etc...  Please follow up with PCP for recheck next week to ensure your symptoms are improving.  Reviewed expectations re: course of current medical issues. Questions answered. Outlined signs and symptoms indicating need for more acute intervention. Patient verbalized understanding. After Visit Summary given.         Durward Parcel, FNP 07/03/20 1219

## 2020-07-03 NOTE — ED Triage Notes (Signed)
Patient c/o sore throat, sinus congestion x 3 days

## 2020-07-04 LAB — COVID-19, FLU A+B NAA
Influenza A, NAA: NOT DETECTED
Influenza B, NAA: NOT DETECTED
SARS-CoV-2, NAA: NOT DETECTED

## 2020-07-26 ENCOUNTER — Ambulatory Visit: Payer: BC Managed Care – PPO | Admitting: Family Medicine

## 2020-08-03 ENCOUNTER — Encounter: Payer: Self-pay | Admitting: Family Medicine

## 2020-08-04 ENCOUNTER — Other Ambulatory Visit: Payer: Self-pay | Admitting: Family Medicine

## 2020-08-04 MED ORDER — BENZONATATE 100 MG PO CAPS: 100.0000 mg | ORAL_CAPSULE | Freq: Three times a day (TID) | ORAL | 0 refills | Status: DC | PRN

## 2020-08-04 MED ORDER — AZITHROMYCIN 250 MG PO TABS: ORAL_TABLET | ORAL | 0 refills | Status: DC

## 2020-08-04 MED ORDER — ALBUTEROL SULFATE HFA 108 (90 BASE) MCG/ACT IN AERS: 2.0000 | INHALATION_SPRAY | RESPIRATORY_TRACT | 2 refills | Status: DC | PRN

## 2020-08-26 ENCOUNTER — Encounter: Payer: Self-pay | Admitting: Family Medicine

## 2020-08-26 ENCOUNTER — Other Ambulatory Visit: Payer: Self-pay

## 2020-08-26 ENCOUNTER — Ambulatory Visit (INDEPENDENT_AMBULATORY_CARE_PROVIDER_SITE_OTHER): Payer: No Typology Code available for payment source | Admitting: Family Medicine

## 2020-08-26 VITALS — BP 110/74 | HR 107 | Temp 98.1°F | Ht 63.0 in | Wt 194.0 lb

## 2020-08-26 DIAGNOSIS — F324 Major depressive disorder, single episode, in partial remission: Secondary | ICD-10-CM | POA: Diagnosis not present

## 2020-08-26 DIAGNOSIS — J019 Acute sinusitis, unspecified: Secondary | ICD-10-CM

## 2020-08-26 MED ORDER — SERTRALINE HCL 100 MG PO TABS: 100.0000 mg | ORAL_TABLET | Freq: Every day | ORAL | 5 refills | Status: DC

## 2020-08-26 MED ORDER — AMOXICILLIN 500 MG PO TABS: 500.0000 mg | ORAL_TABLET | Freq: Three times a day (TID) | ORAL | 0 refills | Status: DC

## 2020-08-26 NOTE — Progress Notes (Signed)
   Subjective:    Patient ID: Jillian Ford, female    DOB: March 18, 1971, 50 y.o.   MRN: [id number]  HPIFollow up on depression. Started on sertraline at last visit. Pt states she misses about 2 doses a week.  She finds her moods are starting to do somewhat better but she still finds her self feeling down but not severely she is now working in [place] and adjusting to the commute back and forth  Head congestion. Started around end of December. States she has had this 3 times since then. Congestion and cough. Started 5 days. Took home covid test 5 days ago when symptoms started and states it was negative.  Patient denies any wheezing high fever chills relates mainly sinus congestion doing a little better now than when she was   Review of Systems Denies being suicidal    Objective:   Physical Exam Eardrums normal nares normal throat normal neck supple lungs clear heart regular       Assessment & Plan:  Viral syndrome with mild sinus symptoms do not need antibiotic currently but if progressive symptoms over the next 5 to 7 days get antibiotic filled  Partial remission of depression could benefit from increased dose bump up the dose to 100 mg patient to do a virtual follow-up in 3 months feel free to reach out to [place] via MyChart within the next 2 to 3 weeks for an update

## 2020-11-14 ENCOUNTER — Other Ambulatory Visit: Payer: Self-pay | Admitting: Nurse Practitioner

## 2020-11-16 ENCOUNTER — Telehealth: Payer: Self-pay

## 2020-11-16 MED ORDER — LEVONORGESTREL-ETHINYL ESTRAD 0.1-20 MG-MCG PO TABS: ORAL_TABLET | ORAL | 0 refills | Status: DC

## 2020-11-16 NOTE — Telephone Encounter (Signed)
Pt needs refill on FALMINA 0.1-20 MG-MCG tablet [facility] - [place], [place] - [address]   Pt call back [phone]

## 2020-11-16 NOTE — Telephone Encounter (Signed)
6 mo rf 

## 2020-11-16 NOTE — Telephone Encounter (Signed)
Prescription sent electronically to pharmacy. Patient notified. 

## 2020-11-23 ENCOUNTER — Encounter: Payer: Self-pay | Admitting: Family Medicine

## 2020-11-23 ENCOUNTER — Ambulatory Visit: Payer: No Typology Code available for payment source | Admitting: Family Medicine

## 2020-11-23 ENCOUNTER — Other Ambulatory Visit: Payer: Self-pay

## 2020-11-23 VITALS — BP 146/92 | HR 96 | Ht 63.0 in | Wt 194.0 lb

## 2020-11-23 DIAGNOSIS — Z975 Presence of (intrauterine) contraceptive device: Secondary | ICD-10-CM

## 2020-11-23 DIAGNOSIS — R030 Elevated blood-pressure reading, without diagnosis of hypertension: Secondary | ICD-10-CM

## 2020-11-23 NOTE — Patient Instructions (Signed)

## 2020-11-23 NOTE — Progress Notes (Signed)
   Subjective:    Patient ID: Jillian Ford, female    DOB: 08-02-1970, 50 y.o.   MRN: [id number]  HPIfollow up on depression. Pt states she forgets to take meds and has been out of zoloft for a while. Pt states she does not think she needs to go back on med.  Patient finds her self feeling stressed at times She has to travel long distance for her job she is hoping to be able to move back to [place] if the job will transfer her back Would like to get referral to gyn to have iud put in because she also forgets to take birth control pills.  Blood pressure been elevated recently.  Eating out a lot.  Has gained weight.  Not exercising.   Review of Systems     Objective:   Physical Exam Vitals reviewed.  Constitutional:      General: She is not in acute distress. HENT:     Head: Normocephalic and atraumatic.  Eyes:     General:        Right eye: No discharge.        Left eye: No discharge.  Neck:     Trachea: No tracheal deviation.  Cardiovascular:     Rate and Rhythm: Normal rate and regular rhythm.     Heart sounds: Normal heart sounds. No murmur heard.   Pulmonary:     Effort: Pulmonary effort is normal. No respiratory distress.     Breath sounds: Normal breath sounds.  Lymphadenopathy:     Cervical: No cervical adenopathy.  Skin:    General: Skin is warm and dry.  Neurological:     Mental Status: She is alert.     Coordination: Coordination normal.  Psychiatric:        Behavior: Behavior normal.    Patient states that she will restart his Zoloft and 1/2 tablet/day to see if this helps her she will give [place] updates follow-up in 6 to 8 weeks       Assessment & Plan:  Patient is somewhat stressed about what is going on at work hopefully she will be moved back to [place] and I will cut down her to you  Referral to gynecology for IUD she needs to get away from birth control  Blood pressure mildly elevated she will devote herself to more physical activity regular  exercise and lower salt and follow-up again in 6 to 8 weeks to recheck she will send [place] blood pressure readings if she checks it outside the office

## 2020-12-06 ENCOUNTER — Encounter: Payer: Self-pay | Admitting: Family Medicine

## 2020-12-07 ENCOUNTER — Encounter: Payer: Self-pay | Admitting: Adult Health

## 2020-12-07 ENCOUNTER — Other Ambulatory Visit: Payer: Self-pay

## 2020-12-07 ENCOUNTER — Ambulatory Visit: Payer: No Typology Code available for payment source | Admitting: Adult Health

## 2020-12-07 VITALS — BP 155/97 | HR 102 | Ht 63.0 in | Wt 195.0 lb

## 2020-12-07 DIAGNOSIS — R030 Elevated blood-pressure reading, without diagnosis of hypertension: Secondary | ICD-10-CM | POA: Diagnosis not present

## 2020-12-07 DIAGNOSIS — Z3009 Encounter for other general counseling and advice on contraception: Secondary | ICD-10-CM | POA: Diagnosis not present

## 2020-12-07 DIAGNOSIS — W57XXXA Bitten or stung by nonvenomous insect and other nonvenomous arthropods, initial encounter: Secondary | ICD-10-CM | POA: Diagnosis not present

## 2020-12-07 NOTE — Telephone Encounter (Signed)
Left message to return call 

## 2020-12-07 NOTE — Telephone Encounter (Signed)
That area certainly seems to be inflamed it could be a localized allergic reaction to the bite but it could also be a sign of infection if it is still going on I could work her in at 4:10 PM today  If that does not work the other option would be 11:45 AM on Thursday

## 2020-12-07 NOTE — Telephone Encounter (Signed)
Patient stated that the provider at her GYN office looked at the bite and told her she didn't see a reason to be concerned currently and she would just keep watch on it and call back if no better or worse.

## 2020-12-07 NOTE — Progress Notes (Signed)
  Subjective:     Patient ID: Jillian Ford, female   DOB: 1971/01/15, 50 y.o.   MRN: [id number]  HPI Jillian Ford is a 50 year old white female, married, G2P2 on OCs in to discuss possible IUD. Periods lite and regular but forgets to take the pill. Has several bug bites. PCP is [facility].  Review of Systems Periods lite and regular on the pill Reviewed past medical,surgical, social and family history. Reviewed medications and allergies.     Objective:   Physical Exam BP (!) 155/97 (BP Location: Left Arm, Patient Position: Sitting, Cuff Size: Normal)   Pulse (!) 102   Ht 5\' 3"  (1.6 m)   Wt 195 lb (88.5 kg)   LMP 11/16/2020 (Approximate)   BMI 34.54 kg/m  Skin warm and dry. Neck: mid line trachea, normal thyroid, good ROM, no lymphadenopathy noted. Lungs: clear to ausculation bilaterally. Cardiovascular: regular rate and rhythm. Has bug bites on left inner arm, back and hip, has erythema and irritation where she scratched. AA is 4 Fall risk is low Depression screen Jillian Ford 2/9 12/07/2020 11/23/2020 08/26/2020  Decreased Interest 1 1 2   Down, Depressed, Hopeless 0 0 1  PHQ - 2 Score 1 1 3   Altered sleeping 1 1 3   Tired, decreased energy 2 1 3   Change in appetite 2 2 2   Feeling bad or failure about yourself  0 1 1  Trouble concentrating 0 0 2  Moving slowly or fidgety/restless 0 0 1  Suicidal thoughts 0 0 0  PHQ-9 Score 6 6 15    GAD 7 : Generalized Anxiety Score 12/07/2020 11/23/2020 03/15/2020  Nervous, Anxious, on Edge 1 1 1   Control/stop worrying 1 1 1   Worry too much - different things 1 1 1   Trouble relaxing 0 1 0  Restless 0 0 0  Easily annoyed or irritable 1 2 3   Afraid - awful might happen 0 0 0  Total GAD 7 Score 4 6 6   Anxiety Difficulty - - Somewhat difficult    Upstream - 12/07/20 0902      Pregnancy Intention Screening   Does the patient want to become pregnant in the next year? No    Does the patient's partner want to become pregnant in the next year? No    Would the  patient like to discuss contraceptive options today? Yes      Contraception Wrap Up   Current Method Oral Contraceptive    End Method Oral Contraceptive    Contraception Counseling Provided Yes             Assessment:     1. General counseling and advice for contraceptive management Discussed ring, depo, IUD and nexaplanon and she is interested in IUD and nexplanon, handouts given for nexplanon and liletta  2. Elevated BP without diagnosis of hypertension Will follow for now   3. Bug bite, initial encounter Take antihistamine, like zyrtec,bendaryl, claritin and use benadryl cream or neosporin cream plus, try to not scratch    Plan:     Pt to call me when decides on IUD or nexplanon, continue OCs for now

## 2020-12-19 ENCOUNTER — Encounter: Payer: Self-pay | Admitting: Family Medicine

## 2020-12-19 ENCOUNTER — Other Ambulatory Visit: Payer: Self-pay | Admitting: Family Medicine

## 2020-12-19 MED ORDER — VALACYCLOVIR HCL 1 G PO TABS: ORAL_TABLET | ORAL | 1 refills | Status: DC

## 2020-12-19 NOTE — Progress Notes (Signed)
Pt having cold sore on lip.  Pt requesting refill on valtrex.   Will send.   Dr. Ladona Ridgel

## 2021-01-18 ENCOUNTER — Telehealth: Payer: Self-pay | Admitting: Family Medicine

## 2021-01-18 NOTE — Telephone Encounter (Signed)
Needs office visit. Can be tomorrow if anything is available.

## 2021-01-18 NOTE — Telephone Encounter (Signed)
Pt voiced that she has been having back pain and has got a at home urinary check test strips and it came back as uti. Patient is wondering if something may be called in or what does she need to do. Patient would like a call back

## 2021-01-19 ENCOUNTER — Ambulatory Visit: Payer: No Typology Code available for payment source | Admitting: Family Medicine

## 2021-01-19 ENCOUNTER — Other Ambulatory Visit: Payer: Self-pay

## 2021-01-19 VITALS — BP 132/90 | Temp 97.3°F | Wt 197.6 lb

## 2021-01-19 DIAGNOSIS — R300 Dysuria: Secondary | ICD-10-CM

## 2021-01-19 DIAGNOSIS — R1030 Lower abdominal pain, unspecified: Secondary | ICD-10-CM | POA: Diagnosis not present

## 2021-01-19 LAB — POCT URINALYSIS DIPSTICK
Protein, UA: POSITIVE — AB
Spec Grav, UA: >=1.03 — AB (ref 1.010–1.025)
pH, UA: 6 (ref 5.0–8.0)

## 2021-01-19 MED ORDER — SULFAMETHOXAZOLE-TRIMETHOPRIM 800-160 MG PO TABS: 1.0000 | ORAL_TABLET | Freq: Two times a day (BID) | ORAL | 0 refills | Status: DC

## 2021-01-19 MED ORDER — PHENAZOPYRIDINE HCL 100 MG PO TABS: 100.0000 mg | ORAL_TABLET | Freq: Three times a day (TID) | ORAL | 0 refills | Status: DC | PRN

## 2021-01-19 NOTE — Patient Instructions (Signed)
https://www.nhlbi.nih.gov/files/docs/public/heart/dash_brief.pdf">  DASH Eating Plan DASH stands for Dietary Approaches to Stop Hypertension. The DASH eating plan is a healthy eating plan that has been shown to: Reduce high blood pressure (hypertension). Reduce your risk for type 2 diabetes, heart disease, and stroke. Help with weight loss. What are tips for following this plan? Reading food labels Check food labels for the amount of salt (sodium) per serving. Choose foods with less than 5 percent of the Daily Value of sodium. Generally, foods with less than 300 milligrams (mg) of sodium per serving fit into this eating plan. To find whole grains, look for the word "whole" as the first word in the ingredient list. Shopping Buy products labeled as "low-sodium" or "no salt added." Buy fresh foods. Avoid canned foods and pre-made or frozen meals. Cooking Avoid adding salt when cooking. Use salt-free seasonings or herbs instead of table salt or sea salt. Check with your health care provider or pharmacist before using salt substitutes. Do not fry foods. Cook foods using healthy methods such as baking, boiling, grilling, roasting, and broiling instead. Cook with heart-healthy oils, such as olive, canola, avocado, soybean, or sunflower oil. Meal planning  Eat a balanced diet that includes: 4 or more servings of fruits and 4 or more servings of vegetables each day. Try to fill one-half of your plate with fruits and vegetables. 6-8 servings of whole grains each day. Less than 6 oz (170 g) of lean meat, poultry, or fish each day. A 3-oz (85-g) serving of meat is about the same size as a deck of cards. One egg equals 1 oz (28 g). 2-3 servings of low-fat dairy each day. One serving is 1 cup (237 mL). 1 serving of nuts, seeds, or beans 5 times each week. 2-3 servings of heart-healthy fats. Healthy fats called omega-3 fatty acids are found in foods such as walnuts, flaxseeds, fortified milks, and eggs.  These fats are also found in cold-water fish, such as sardines, salmon, and mackerel. Limit how much you eat of: Canned or prepackaged foods. Food that is high in trans fat, such as some fried foods. Food that is high in saturated fat, such as fatty meat. Desserts and other sweets, sugary drinks, and other foods with added sugar. Full-fat dairy products. Do not salt foods before eating. Do not eat more than 4 egg yolks a week. Try to eat at least 2 vegetarian meals a week. Eat more home-cooked food and less restaurant, buffet, and fast food.  Lifestyle When eating at a restaurant, ask that your food be prepared with less salt or no salt, if possible. If you drink alcohol: Limit how much you use to: 0-1 drink a day for women who are not pregnant. 0-2 drinks a day for men. Be aware of how much alcohol is in your drink. In the [place] one drink equals one 12 oz bottle of beer (355 mL), one 5 oz glass of wine (148 mL), or one 1 oz glass of hard liquor (44 mL). General information Avoid eating more than 2,300 mg of salt a day. If you have hypertension, you may need to reduce your sodium intake to 1,500 mg a day. Work with your health care provider to maintain a healthy body weight or to lose weight. Ask what an ideal weight is for you. Get at least 30 minutes of exercise that causes your heart to beat faster (aerobic exercise) most days of the week. Activities may include walking, swimming, or biking. Work with your health care provider   or dietitian to adjust your eating plan to your individual calorie needs. What foods should I eat? Fruits All fresh, dried, or frozen fruit. Canned fruit in natural juice (without addedsugar). Vegetables Fresh or frozen vegetables (raw, steamed, roasted, or grilled). Low-sodium or reduced-sodium tomato and vegetable juice. Low-sodium or reduced-sodium tomatosauce and tomato paste. Low-sodium or reduced-sodium canned vegetables. Grains Whole-grain or  whole-wheat bread. Whole-grain or whole-wheat pasta. Brown rice. Oatmeal. Quinoa. Bulgur. Whole-grain and low-sodium cereals. Pita bread.Low-fat, low-sodium crackers. Whole-wheat flour tortillas. Meats and other proteins Skinless chicken or turkey. Ground chicken or turkey. Pork with fat trimmed off. Fish and seafood. Egg whites. Dried beans, peas, or lentils. Unsalted nuts, nut butters, and seeds. Unsalted canned beans. Lean cuts of beef with fat trimmed off. Low-sodium, lean precooked or cured meat, such as sausages or meatloaves. Dairy Low-fat (1%) or fat-free (skim) milk. Reduced-fat, low-fat, or fat-free cheeses. Nonfat, low-sodium ricotta or cottage cheese. Low-fat or nonfatyogurt. Low-fat, low-sodium cheese. Fats and oils Soft margarine without trans fats. Vegetable oil. Reduced-fat, low-fat, or light mayonnaise and salad dressings (reduced-sodium). Canola, safflower, olive, avocado, soybean, andsunflower oils. Avocado. Seasonings and condiments Herbs. Spices. Seasoning mixes without salt. Other foods Unsalted popcorn and pretzels. Fat-free sweets. The items listed above may not be a complete list of foods and beverages you can eat. Contact a dietitian for more information. What foods should I avoid? Fruits Canned fruit in a light or heavy syrup. Fried fruit. Fruit in cream or buttersauce. Vegetables Creamed or fried vegetables. Vegetables in a cheese sauce. Regular canned vegetables (not low-sodium or reduced-sodium). Regular canned tomato sauce and paste (not low-sodium or reduced-sodium). Regular tomato and vegetable juice(not low-sodium or reduced-sodium). Pickles. Olives. Grains Baked goods made with fat, such as croissants, muffins, or some breads. Drypasta or rice meal packs. Meats and other proteins Fatty cuts of meat. Ribs. Fried meat. Bacon. Bologna, salami, and other precooked or cured meats, such as sausages or meat loaves. Fat from the back of a pig (fatback). Bratwurst.  Salted nuts and seeds. Canned beans with added salt. Canned orsmoked fish. Whole eggs or egg yolks. Chicken or turkey with skin. Dairy Whole or 2% milk, cream, and half-and-half. Whole or full-fat cream cheese. Whole-fat or sweetened yogurt. Full-fat cheese. Nondairy creamers. Whippedtoppings. Processed cheese and cheese spreads. Fats and oils Butter. Stick margarine. Lard. Shortening. Ghee. Bacon fat. Tropical oils, suchas coconut, palm kernel, or palm oil. Seasonings and condiments Onion salt, garlic salt, seasoned salt, table salt, and sea salt. Worcestershire sauce. Tartar sauce. Barbecue sauce. Teriyaki sauce. Soy sauce, including reduced-sodium. Steak sauce. Canned and packaged gravies. Fish sauce. Oyster sauce. Cocktail sauce. Store-bought horseradish. Ketchup. Mustard. Meat flavorings and tenderizers. Bouillon cubes. Hot sauces. Pre-made or packaged marinades. Pre-made or packaged taco seasonings. Relishes. Regular saladdressings. Other foods Salted popcorn and pretzels. The items listed above may not be a complete list of foods and beverages you should avoid. Contact a dietitian for more information. Where to find more information National Heart, Lung, and Blood Institute: www.nhlbi.nih.gov American Heart Association: www.heart.org Academy of Nutrition and Dietetics: www.eatright.org National Kidney Foundation: www.kidney.org Summary The DASH eating plan is a healthy eating plan that has been shown to reduce high blood pressure (hypertension). It may also reduce your risk for type 2 diabetes, heart disease, and stroke. When on the DASH eating plan, aim to eat more fresh fruits and vegetables, whole grains, lean proteins, low-fat dairy, and heart-healthy fats. With the DASH eating plan, you should limit salt (sodium) intake to 2,300   mg a day. If you have hypertension, you may need to reduce your sodium intake to 1,500 mg a day. Work with your health care provider or dietitian to adjust  your eating plan to your individual calorie needs. This information is not intended to replace advice given to you by your health care provider. Make sure you discuss any questions you have with your healthcare provider. Document Revised: 06/05/2019 Document Reviewed: 06/05/2019 Elsevier Patient Education  2022 Elsevier Inc.  

## 2021-01-19 NOTE — Progress Notes (Signed)
   Subjective:    Patient ID: Jillian Ford, female    DOB: January 20, 1971, 50 y.o.   MRN: [id number]  HPI Pt having back pain, woke up yesterday feeling awful, frequency with urination, nausea at times, possible low grade fever on Tuesday night. Symptoms began Tuesday. UTI test kit at home showed infection. Pt has been utilizing Tylenol and Advil.  Relates some back pain and also finds her self with frequency in urination and intermittent nausea low-grade fever earlier in the week but none now denies shakes or chills denies vomiting  Review of Systems     Objective:   Physical Exam Lungs clear heart regular flank nontender abdomen is soft with subjective discomfort in the mid lower abdomen but no guarding or rebound       Assessment & Plan:  Urine looks cloudy under the microscope some WBCs Sent for culture Antibiotic prescribed If any allergic reaction antibiotic stop immediately and seek help If symptoms or not improved by Monday morning consideration for lab work and scan especially if culture is negative

## 2021-01-23 ENCOUNTER — Ambulatory Visit: Payer: No Typology Code available for payment source | Admitting: Family Medicine

## 2021-01-24 ENCOUNTER — Encounter: Payer: Self-pay | Admitting: Family Medicine

## 2021-01-31 ENCOUNTER — Other Ambulatory Visit: Payer: Self-pay | Admitting: Family Medicine

## 2021-02-01 MED ORDER — LEVONORGESTREL-ETHINYL ESTRAD 0.1-20 MG-MCG PO TABS: ORAL_TABLET | ORAL | 0 refills | Status: DC

## 2021-02-02 LAB — URINE CULTURE

## 2021-02-02 NOTE — Telephone Encounter (Signed)
Return call from [place] at [facility]. Inetta Fermo states she just got off the phone with the branch and they did not key it up so there are no results. Inetta Fermo states this was a mix up on their end. Inetta Fermo states she will work on getting the final report faxed over. Await fax.

## 2021-02-02 NOTE — Telephone Encounter (Signed)
Contacted [facility]. Rep states that the results failed to upload to system. They are going to fax over results. Contacted patient to let her know what was going on and pt verbalized understanding.

## 2021-04-26 ENCOUNTER — Other Ambulatory Visit: Payer: Self-pay | Admitting: Family Medicine

## 2021-05-26 ENCOUNTER — Ambulatory Visit
Admission: EM | Admit: 2021-05-26 | Discharge: 2021-05-26 | Disposition: A | Discharge status: Home / Self Care | Payer: No Typology Code available for payment source | Attending: Emergency Medicine | Admitting: Emergency Medicine

## 2021-05-26 DIAGNOSIS — S83412A Sprain of medial collateral ligament of left knee, initial encounter: Secondary | ICD-10-CM

## 2021-05-26 MED ORDER — IBUPROFEN 600 MG PO TABS: 600.0000 mg | ORAL_TABLET | Freq: Four times a day (QID) | ORAL | 0 refills | Status: DC | PRN

## 2021-05-26 NOTE — Discharge Instructions (Addendum)
Try a knee sleeve.  [facility] in [place] makes excellent neoprene knee sleeves, but you can get them at the drugstore off of [address].  600 mg of ibuprofen, 1000 mg Tylenol together 3-4 times a day as needed for pain.  Ice your knee after use.  Rest.  Follow-up with Dr. Romeo Apple if no better with conservative treatment in 7 to 10 days

## 2021-05-26 NOTE — ED Provider Notes (Signed)
HPI  SUBJECTIVE:  Jillian Ford is a 50 y.o. female who presents with left medial knee and posterior thigh pain starting today that is present with weightbearing and movement only.  She describes it as feeling sore, swollen, and tight.  No trauma to the area.  She does not recall twisting her leg.  She states that she was walking earlier today, something "popped", was followed by pain.  No erythema, distal numbness or tingling.  She symptoms are worse with weightbearing and movement.  No alleviating factors.  She has not tried anything for this.  She has a past medical history of left knee injury, whitecoat hypertension.  GYI:RSWNIO, Jonna Coup, MD   History reviewed. No pertinent past medical history.  History reviewed. No pertinent surgical history.  Family History  Problem Relation Age of Onset   Other Mother        brain injury   Dementia Maternal Grandmother    Heart attack Paternal Grandmother     Social History   Tobacco Use   Smoking status: Never   Smokeless tobacco: Never  Vaping Use   Vaping Use: Never used  Substance Use Topics   Alcohol use: Yes    Comment: occas social   Drug use: No    No current facility-administered medications for this encounter.  Current Outpatient Medications:    ibuprofen (ADVIL) 600 MG tablet, Take 1 tablet (600 mg total) by mouth every 6 (six) hours as needed., Disp: 30 tablet, Rfl: 0   albuterol (VENTOLIN HFA) 108 (90 Base) MCG/ACT inhaler, Inhale 2 puffs into the lungs every 4 (four) hours as needed for wheezing., Disp: 1 each, Rfl: 2   FALMINA 0.1-20 MG-MCG tablet, TAKE ONE (1) TABLET BY MOUTH EVERY DAY AS DIRECTED, Disp: 84 tablet, Rfl: 0   fluticasone (FLONASE) 50 MCG/ACT nasal spray, Place 1 spray into both nostrils daily for 14 days., Disp: 16 g, Rfl: 0  Allergies  Allergen Reactions   Wellbutrin [Bupropion] Itching and Rash     ROS  As noted in HPI.   Physical Exam  BP (S) (!) 175/99 (BP Location: Right Arm) Comment: Pt  states she has white coat syndrome  Pulse 97   Temp 99.1 F (37.3 C) (Oral)   Resp 16   SpO2 96%   Constitutional: Well developed, well nourished, no acute distress Eyes:  EOMI, conjunctiva normal bilaterally HENT: Normocephalic, atraumatic,mucus membranes moist Respiratory: Normal inspiratory effort Cardiovascular: Normal rate GI: nondistended skin: No rash, skin intact Musculoskeletal: L Knee; medial joint tenderness, tenderness along the medial retinaculum.   ROM baseline for Pt , Flexion  intact ,  Patella NT, Patellar tendon NT, Lateral joint NT, Popliteal region NT, Varus MCL stress testing stable, Valgus LCL stress testing stable, McMurray's testing abnormal, Lachman's negative. Distal NVI with intact baseline sensation / motor / pulse distal to knee.  No effusion. No erythema. No increased temperature. No crepitus.  Patient able to bear weight in the department. Neurologic: Alert & oriented x 3, no focal neuro deficits Psychiatric: Speech and behavior appropriate   ED Course   Medications - No data to display  No orders of the defined types were placed in this encounter.   No results found for this or any previous visit (from the past 24 hour(s)). No results found.  ED Clinical Impression  1. Sprain of medial collateral ligament of left knee, initial encounter      ED Assessment/Plan  Deferring imaging in the absence of trauma.  Suspect  knee sprain.  Home with neoprene sleeve, gave her information for [facility] in [place], Tylenol/ibuprofen, ice, rest.  Follow-up with Dr. Romeo Apple, orthopedist on-call if not better in a week to 10 days with conservative treatment  Discussed MDM, treatment plan, and plan for follow-up with patient. . patient agrees with plan.   Meds ordered this encounter  Medications   ibuprofen (ADVIL) 600 MG tablet    Sig: Take 1 tablet (600 mg total) by mouth every 6 (six) hours as needed.    Dispense:  30 tablet    Refill:  0       *This clinic note was created using Scientist, clinical (histocompatibility and immunogenetics). Therefore, there may be occasional mistakes despite careful proofreading.  ?    Domenick Gong, MD 05/28/21 1437

## 2021-05-26 NOTE — ED Triage Notes (Addendum)
Patient presents to Urgent Care with complaints of left knee pain today. Pt has a hx of chronic knee pain from a fall "many years ago." Not treated knee pain. She states the pain started after work today.   Denies injury.

## 2021-06-28 ENCOUNTER — Ambulatory Visit
Admission: EM | Admit: 2021-06-28 | Discharge: 2021-06-28 | Disposition: A | Discharge status: Home / Self Care | Payer: No Typology Code available for payment source | Attending: Family Medicine | Admitting: Family Medicine

## 2021-06-28 ENCOUNTER — Other Ambulatory Visit: Payer: Self-pay

## 2021-06-28 DIAGNOSIS — R197 Diarrhea, unspecified: Secondary | ICD-10-CM | POA: Diagnosis not present

## 2021-06-28 DIAGNOSIS — R059 Cough, unspecified: Secondary | ICD-10-CM

## 2021-06-28 DIAGNOSIS — J069 Acute upper respiratory infection, unspecified: Secondary | ICD-10-CM

## 2021-06-28 MED ORDER — PROMETHAZINE-DM 6.25-15 MG/5ML PO SYRP: 5.0000 mL | ORAL_SOLUTION | Freq: Four times a day (QID) | ORAL | 0 refills | Status: DC | PRN

## 2021-06-28 MED ORDER — AZITHROMYCIN 500 MG PO TABS: 500.0000 mg | ORAL_TABLET | Freq: Every day | ORAL | 0 refills | Status: DC

## 2021-06-28 NOTE — ED Provider Notes (Signed)
[facility]   [id number] 06/28/21 Arrival Time: 1654  ASSESSMENT & PLAN:  1. Cough, unspecified type   2. Diarrhea of presumed infectious origin   3. Viral URI with cough    Given recent travel to [place] will tx empirically for travelers diarrhea. Influenza/COVID testing sent.  Meds ordered this encounter  Medications   azithromycin (ZITHROMAX) 500 MG tablet    Sig: Take 1 tablet (500 mg total) by mouth daily.    Dispense:  3 tablet    Refill:  0   promethazine-dextromethorphan (PROMETHAZINE-DM) 6.25-15 MG/5ML syrup    Sig: Take 5 mLs by mouth 4 (four) times daily as needed for cough.    Dispense:  118 mL    Refill:  0   Work note. Will do her best to ensure adequate fluid intake in order to avoid dehydration.   Follow-up Information     Luking, Jonna Coup, MD.   Specialty: Family Medicine Why: As needed. Contact information: [address] [place] [address] [phone]                  Reviewed expectations re: course of current medical issues. Questions answered. Outlined signs and symptoms indicating need for more acute intervention. Patient verbalized understanding. After Visit Summary given.   SUBJECTIVE: History from: patient.  Jillian Ford is a 50 y.o. female who returned from [place] 2 d ago. Now with non-bloody diarrhea. No n/v. Also with chills and cough x few days. No SOB/CP. Afebrile. Tolerating PO intake but exacerbates diarrhea. Abd cramping. No back pain. Ambulatory. No urinary symptoms.  No LMP recorded. (Menstrual status: Oral contraceptives).   OBJECTIVE:  Vitals:   06/28/21 1710  BP: (!) 165/113  Pulse: (!) 110  Resp: 18  Temp: 99.7 F (37.6 C)  TempSrc: Oral  SpO2: 95%    Slight tachycardia noted.  General appearance: alert; no distress Lungs: unlabored Abdomen: benign Back: no CVA tenderness Extremities: no edema; symmetrical with no gross deformities Skin: warm; dry Neurologic: normal  gait Psychological: alert and cooperative; normal mood and affect  Labs: Labs Reviewed  COVID-19, FLU A+B NAA    Allergies  Allergen Reactions   Wellbutrin [Bupropion] Itching and Rash                                               History reviewed. No pertinent past medical history. Social History   Socioeconomic History   Marital status: Married    Spouse name: Not on file   Number of children: Not on file   Years of education: Not on file   Highest education level: Not on file  Occupational History   Not on file  Tobacco Use   Smoking status: Never   Smokeless tobacco: Never  Vaping Use   Vaping Use: Never used  Substance and Sexual Activity   Alcohol use: Yes    Comment: social   Drug use: Never   Sexual activity: Not Currently    Birth control/protection: Pill  Other Topics Concern   Not on file  Social History Narrative   Not on file   Social Determinants of Health   Financial Resource Strain: Low Risk    Difficulty of Paying Living Expenses: Not hard at all  Food Insecurity: No Food Insecurity   Worried About Radiation protection practitioner of Food in the Last Year:  Never true   Ran Out of Food in the Last Year: Never true  Transportation Needs: No Transportation Needs   Lack of Transportation (Medical): No   Lack of Transportation (Non-Medical): No  Physical Activity: Insufficiently Active   Days of Exercise per Week: 2 days   Minutes of Exercise per Session: 50 min  Stress: No Stress Concern Present   Feeling of Stress : Only a little  Social Connections: Moderately Integrated   Frequency of Communication with Friends and Family: Three times a week   Frequency of Social Gatherings with Friends and Family: Twice a week   Attends Religious Services: 1 to 4 times per year   Active Member of [employer] or Organizations: No   Attends Banker Meetings: Never   Marital Status: Married  Catering manager Violence: Not At Risk   Fear of Current or Ex-Partner: No    Emotionally Abused: No   Physically Abused: No   Sexually Abused: No   Family History  Problem Relation Age of Onset   Other Mother        brain injury   Dementia Maternal Grandmother    Heart attack Paternal Dulce Sellar, MD 06/28/21 [phone]

## 2021-06-28 NOTE — ED Triage Notes (Signed)
Pt reports diarrhea, headache, chills, cough x 2 days. Reports she came back from [place] 4 days ago.

## 2021-06-28 NOTE — Discharge Instructions (Addendum)
You have been tested for COVID-19 today. °If your test returns positive, you will receive a phone call from [facility] regarding your results. °Negative test results are not called. °Both positive and negative results area always visible on MyChart. °If you do not have a MyChart account, sign up instructions are provided in your discharge papers. °Please do not hesitate to contact us should you have questions or concerns. ° °

## 2021-06-29 LAB — COVID-19, FLU A+B NAA
Influenza A, NAA: NOT DETECTED
Influenza B, NAA: NOT DETECTED
SARS-CoV-2, NAA: NOT DETECTED

## 2021-08-02 ENCOUNTER — Encounter: Payer: Self-pay | Admitting: Family Medicine

## 2021-08-02 ENCOUNTER — Other Ambulatory Visit: Payer: Self-pay | Admitting: Family Medicine

## 2021-08-18 ENCOUNTER — Encounter: Payer: Self-pay | Admitting: Family Medicine

## 2021-08-18 MED ORDER — VALACYCLOVIR HCL 1 G PO TABS: ORAL_TABLET | ORAL | 4 refills | Status: DC

## 2021-08-18 MED ORDER — VALACYCLOVIR HCL 500 MG PO TABS: 500.0000 mg | ORAL_TABLET | Freq: Every day | ORAL | 5 refills | Status: DC

## 2021-08-18 NOTE — Telephone Encounter (Signed)
Nurses-here is the approach that I would recommend  Valtrex 1000 mg take 2 tablets now then take 2 tablets in 12 hours #4 with 4 refills This can be done whenever she has a outbreak  Plus Valtrex 500 mg 1 daily #30 with 6 refills the purpose of this is to suppress the cold sores and prevent them from coming  You may share this note with Kaleyah She can give [place] updates on how things are going Thanks-Dr. Lorin Picket

## 2022-02-14 DIAGNOSIS — Z0289 Encounter for other administrative examinations: Secondary | ICD-10-CM

## 2022-02-28 ENCOUNTER — Ambulatory Visit (INDEPENDENT_AMBULATORY_CARE_PROVIDER_SITE_OTHER): Payer: 59 | Admitting: Family Medicine

## 2022-02-28 ENCOUNTER — Encounter (INDEPENDENT_AMBULATORY_CARE_PROVIDER_SITE_OTHER): Payer: Self-pay | Admitting: Family Medicine

## 2022-02-28 VITALS — BP 165/113 | HR 86 | Temp 98.3°F | Ht 63.0 in | Wt 178.0 lb

## 2022-02-28 DIAGNOSIS — R0683 Snoring: Secondary | ICD-10-CM | POA: Diagnosis not present

## 2022-02-28 DIAGNOSIS — I10 Essential (primary) hypertension: Secondary | ICD-10-CM | POA: Diagnosis not present

## 2022-02-28 DIAGNOSIS — F3289 Other specified depressive episodes: Secondary | ICD-10-CM

## 2022-02-28 DIAGNOSIS — R0602 Shortness of breath: Secondary | ICD-10-CM

## 2022-02-28 DIAGNOSIS — N912 Amenorrhea, unspecified: Secondary | ICD-10-CM | POA: Diagnosis not present

## 2022-02-28 DIAGNOSIS — E669 Obesity, unspecified: Secondary | ICD-10-CM | POA: Diagnosis not present

## 2022-02-28 DIAGNOSIS — R5383 Other fatigue: Secondary | ICD-10-CM

## 2022-02-28 DIAGNOSIS — Z6831 Body mass index (BMI) 31.0-31.9, adult: Secondary | ICD-10-CM

## 2022-02-28 MED ORDER — AMLODIPINE BESYLATE 5 MG PO TABS: 5.0000 mg | ORAL_TABLET | Freq: Every day | ORAL | 0 refills | Status: DC

## 2022-03-05 LAB — COMPREHENSIVE METABOLIC PANEL
ALT: 38 IU/L — ABNORMAL HIGH (ref 0–32)
AST: 20 IU/L (ref 0–40)
Albumin/Globulin Ratio: 1.7 (ref 1.2–2.2)
Albumin: 4.3 g/dL (ref 3.8–4.9)
Alkaline Phosphatase: 113 IU/L (ref 44–121)
BUN/Creatinine Ratio: 17 (ref 9–23)
BUN: 11 mg/dL (ref 6–24)
Bilirubin Total: 0.6 mg/dL (ref 0.0–1.2)
CO2: 23 mmol/L (ref 20–29)
Calcium: 9 mg/dL (ref 8.7–10.2)
Chloride: 102 mmol/L (ref 96–106)
Creatinine, Ser: 0.65 mg/dL (ref 0.57–1.00)
Globulin, Total: 2.5 g/dL (ref 1.5–4.5)
Glucose: 74 mg/dL (ref 70–99)
Potassium: 4 mmol/L (ref 3.5–5.2)
Sodium: 139 mmol/L (ref 134–144)
Total Protein: 6.8 g/dL (ref 6.0–8.5)
eGFR: 107 mL/min/{1.73_m2} (ref >59)

## 2022-03-05 LAB — CBC
Hematocrit: 46.8 % — ABNORMAL HIGH (ref 34.0–46.6)
Hemoglobin: 15.9 g/dL (ref 11.1–15.9)
MCH: 31 pg (ref 26.6–33.0)
MCHC: 34 g/dL (ref 31.5–35.7)
MCV: 91 fL (ref 79–97)
Platelets: 306 10*3/uL (ref 150–450)
RBC: 5.13 x10E6/uL (ref 3.77–5.28)
RDW: 12.3 % (ref 11.7–15.4)
WBC: 8.6 10*3/uL (ref 3.4–10.8)

## 2022-03-05 LAB — VITAMIN D, 25-HYDROXY, TOTAL: Vitamin D, 25-Hydroxy, Serum: 39 ng/mL

## 2022-03-05 LAB — LIPID PANEL
Chol/HDL Ratio: 1.4 ratio (ref 0.0–4.4)
Cholesterol, Total: 115 mg/dL (ref 100–199)
HDL: 82 mg/dL (ref >39)
LDL Chol Calc (NIH): 23 mg/dL (ref 0–99)
Triglycerides: 31 mg/dL (ref 0–149)
VLDL Cholesterol Cal: 10 mg/dL (ref 5–40)

## 2022-03-05 LAB — VITAMIN B12: Vitamin B-12: 516 pg/mL (ref 232–1245)

## 2022-03-05 LAB — INSULIN, RANDOM: INSULIN: 9.9 u[IU]/mL (ref 2.6–24.9)

## 2022-03-05 LAB — HEMOGLOBIN A1C
Est. average glucose Bld gHb Est-mCnc: 108 mg/dL
Hgb A1c MFr Bld: 5.4 % (ref 4.8–5.6)

## 2022-03-05 LAB — TSH RFX ON ABNORMAL TO FREE T4: TSH: 1.58 u[IU]/mL (ref 0.450–4.500)

## 2022-03-12 NOTE — Progress Notes (Unsigned)
Chief Complaint:   OBESITY Jillian Ford (MR# [id number]) is a 51 y.o. female who presents for evaluation and treatment of obesity and related comorbidities. Current BMI is Body mass index is 31.53 kg/m. Jillian Ford has been struggling with her weight for many years and has been unsuccessful in either losing weight, maintaining weight loss, or reaching her healthy weight goal.  Jillian Ford's weight has increased in 3 years.  Gained 50 pounds up to 200 pounds and has started to work on weight loss 7 months and lost 25 pounds.  Weight has been harder to lose.  Never used AOM.  Doing more stress eating at work, comfort foods, sometimes sweets.  Drinks regular soda 2 times a week, has cut back.  Denies big portions.  Denies meal skipping.  Picky about veggies.  Husband is also losing weight.  Jillian Ford is currently in the action stage of change and ready to dedicate time achieving and maintaining a healthier weight. Jillian Ford is interested in becoming our patient and working on intensive lifestyle modifications including (but not limited to) diet and exercise for weight loss.  Jillian Ford's habits were reviewed today and are as follows: Her family eats meals together, she thinks her family will eat healthier with her, her desired weight loss is 43 lbs, she started gaining weight maybe 5 years, her heaviest weight ever was 200 pounds, she is a picky eater and doesn't like to eat healthier foods, she has significant food cravings issues, she is frequently drinking liquids with calories, she frequently makes poor food choices, she has problems with excessive hunger, and she struggles with emotional eating.  Depression Screen Jillian Ford's Food and Mood (modified PHQ-9) score was 14.     02/28/2022    8:06 AM  Depression screen PHQ 2/9  Decreased Interest 2  Down, Depressed, Hopeless 2  PHQ - 2 Score 4  Altered sleeping 3  Tired, decreased energy 3  Change in appetite 1  Feeling bad or failure about yourself  0  Trouble  concentrating 3  Moving slowly or fidgety/restless 0  Suicidal thoughts 0  PHQ-9 Score 14  Difficult doing work/chores Somewhat difficult   Subjective:   1. Other fatigue Jillian Ford admits to daytime somnolence and admits to waking up still tired. Patient has a history of symptoms of daytime fatigue, morning fatigue, and morning headache. Jillian Ford generally gets 8 hours of sleep per night, and states that she has nightime awakenings. Snoring is present. Apneic episodes are present. Epworth Sleepiness Score is 14.   2. SOB (shortness of breath) Jillian Ford notes increasing shortness of breath with exercising and seems to be worsening over time with weight gain. She notes getting out of breath sooner with activity than she used to. This has not gotten worse recently. Jillian Ford denies shortness of breath at rest or orthopnea.  3. Snoring Jillian Ford Epworth score is 14. Dismpted sleep.  Gets 6 to 7 hours of sleep at night.  Denies vasomotor flushing at night.  4. Primary hypertension Jillian Ford has a new diagnosis.  Her blood pressure is high today and has continued to run high at >140/90.  She is getting headaches and has a family history of hypertension.  5. Amenorrhea Jillian Ford has been off her oral contraceptive for 7 months and had absence of menses.  Went through vasomotor flushing 2 to 3 years ago.  6. Other depression, with emotional eating Jillian Ford's PHQ-9 score is 14.  Allergy to Wellbutrin, and she is not on any mood medications.  Has  a good support system at home.  Assessment/Plan:   1. Other fatigue Jillian Ford does feel that her weight is causing her energy to be lower than it should be. Fatigue may be related to obesity, depression or many other causes. Labs will be ordered, and in the meanwhile, Jillian Ford will focus on self care including making healthy food choices, increasing physical activity and focusing on stress reduction.  - EKG 12-Lead - Comprehensive metabolic panel - Vitamin B12 - Vitamin D, 25-Hydroxy,  Total - CBC - TSH Rfx on Abnormal to Free T4 - Hemoglobin A1c - Insulin, random  2. SOB (shortness of breath) Jillian Ford does feel that she gets out of breath more easily that she used to when she exercises. Jillian Ford's shortness of breath appears to be obesity related and exercise induced. She has agreed to work on weight loss and gradually increase exercise to treat her exercise induced shortness of breath. Will continue to monitor closely.  3. Snoring Jillian Ford will consider a sleep study.  4. Primary hypertension We will check labs today. Jillian Ford will begin amlodipine 5 mg once daily with no refills.  We will recheck her blood pressure in 2 weeks.  - Comprehensive metabolic panel - Lipid panel - amLODipine (NORVASC) 5 MG tablet; Take 1 tablet (5 mg total) by mouth daily.  Dispense: 30 tablet; Refill: 0  5. Amenorrhea We will check FSH and LH with labs today to see if Jillian Ford is postmenopausal.  - FSH/LH  6. Other depression, with emotional eating Jillian Ford is to focus on stress reduction and mindful eating.  7. Obesity, current BMI 31.6 Jillian Ford is currently in the action stage of change and her goal is to continue with weight loss efforts. I recommend Jillian Ford begin the structured treatment plan as follows:  She has agreed to the Category 3 Plan and keeping a food journal and adhering to recommended goals of 1500 calories and 90 grams of protein daily.  Exercise goals: As is.    Behavioral modification strategies: increasing lean protein intake, increasing vegetables, increasing water intake, decreasing eating out, no skipping meals, better snacking choices, and decreasing junk food.  She was informed of the importance of frequent follow-up visits to maximize her success with intensive lifestyle modifications for her multiple health conditions. She was informed we would discuss her lab results at her next visit unless there is a critical issue that needs to be addressed sooner. Jillian Ford agreed to keep her  next visit at the agreed upon time to discuss these results.  Objective:   Blood pressure (!) 165/113, pulse 86, temperature 98.3 F (36.8 C), height 5\' 3"  (1.6 m), weight 178 lb (80.7 kg), SpO2 92 %. Body mass index is 31.53 kg/m.  EKG: Normal sinus rhythm, rate 84 BPM.  Indirect Calorimeter completed today shows a VO2 of 316 and a REE of 2174.  Her calculated basal metabolic rate is thus her basal metabolic rate is better than expected.  General: Cooperative, alert, well developed, in no acute distress. HEENT: Conjunctivae and lids unremarkable. Cardiovascular: Regular rhythm.  Lungs: Normal work of breathing. Neurologic: No focal deficits.   Lab Results  Component Value Date   CREATININE 0.65 02/28/2022   BUN 11 02/28/2022   NA 139 02/28/2022   K 4.0 02/28/2022   CL 102 02/28/2022   CO2 23 02/28/2022   Lab Results  Component Value Date   ALT 38 (H) 02/28/2022   AST 20 02/28/2022   ALKPHOS 113 02/28/2022   BILITOT 0.6 02/28/2022  Lab Results  Component Value Date   HGBA1C 5.4 02/28/2022   Lab Results  Component Value Date   INSULIN 9.9 02/28/2022   Lab Results  Component Value Date   TSH 1.580 02/28/2022   Lab Results  Component Value Date   CHOL 115 02/28/2022   HDL 82 02/28/2022   LDLCALC 23 02/28/2022   TRIG 31 02/28/2022   CHOLHDL 1.4 02/28/2022   Lab Results  Component Value Date   WBC 8.6 02/28/2022   HGB 15.9 02/28/2022   HCT 46.8 (H) 02/28/2022   MCV 91 02/28/2022   PLT 306 02/28/2022   Lab Results  Component Value Date   IRON 102 06/13/2020   FERRITIN 122 06/13/2020   Attestation Statements:   Reviewed by clinician on day of visit: allergies, medications, problem list, medical history, surgical history, family history, social history, and previous encounter notes.   Trude Mcburney, am acting as transcriptionist for Seymour Bars, DO.  I have reviewed the above documentation for accuracy and completeness, and I agree with the  above. Glennis Brink, DO

## 2022-03-14 ENCOUNTER — Encounter (INDEPENDENT_AMBULATORY_CARE_PROVIDER_SITE_OTHER): Payer: Self-pay | Admitting: Family Medicine

## 2022-03-14 ENCOUNTER — Ambulatory Visit (INDEPENDENT_AMBULATORY_CARE_PROVIDER_SITE_OTHER): Payer: 59 | Admitting: Family Medicine

## 2022-03-14 VITALS — BP 140/84 | HR 85 | Temp 98.1°F | Ht 64.0 in | Wt 176.0 lb

## 2022-03-14 DIAGNOSIS — I10 Essential (primary) hypertension: Secondary | ICD-10-CM | POA: Diagnosis not present

## 2022-03-14 DIAGNOSIS — E559 Vitamin D deficiency, unspecified: Secondary | ICD-10-CM

## 2022-03-14 DIAGNOSIS — E669 Obesity, unspecified: Secondary | ICD-10-CM

## 2022-03-14 DIAGNOSIS — N911 Secondary amenorrhea: Secondary | ICD-10-CM | POA: Diagnosis not present

## 2022-03-14 DIAGNOSIS — F32A Depression, unspecified: Secondary | ICD-10-CM | POA: Insufficient documentation

## 2022-03-14 DIAGNOSIS — F3289 Other specified depressive episodes: Secondary | ICD-10-CM

## 2022-03-14 DIAGNOSIS — N912 Amenorrhea, unspecified: Secondary | ICD-10-CM | POA: Insufficient documentation

## 2022-03-14 DIAGNOSIS — Z6830 Body mass index (BMI) 30.0-30.9, adult: Secondary | ICD-10-CM

## 2022-03-14 DIAGNOSIS — R7401 Elevation of levels of liver transaminase levels: Secondary | ICD-10-CM | POA: Diagnosis not present

## 2022-03-14 MED ORDER — LOSARTAN POTASSIUM-HCTZ 50-12.5 MG PO TABS: 1.0000 | ORAL_TABLET | Freq: Every day | ORAL | 0 refills | Status: DC

## 2022-03-15 LAB — FSH/LH
FSH: 11.2 m[IU]/mL
LH: 12.5 m[IU]/mL

## 2022-03-25 NOTE — Progress Notes (Unsigned)
Chief Complaint:   OBESITY Jillian Ford is here to discuss her progress with her obesity treatment plan along with follow-up of her obesity related diagnoses. Jillian Ford is on Category 3 Plan and keeping a food journal and adhering to recommended goals of 1500 calories and 90 grams of protein dailyand states she is following her eating plan approximately 70% of the time. Jillian Ford states she is golfing 2-4 hours 2 times per week.  Today's visit was #: 2 Starting weight: 178 lbs Starting date: 02/28/2022 Today's weight: 176 lbs Today's date: 03/14/2022 Total lbs lost to date: 2 lbs Total lbs lost since last in-office visit: 2 lbs  Interim History: Eating out less.  Doing better with meal planning. Denies cravings.  Allows 2 snacks per day.  Brings lunch to work, eating out less.  Working on [employer], denies hunger.  Has cut out sodas.   Subjective:   1. Essential hypertension Blood pressure elevated with new start of Amlodipine 5 mg daily for 2 weeks.  Complains of leg edema as a side effect.    2. Secondary amenorrhea FSH/LH level not drawn last visit.    3. Elevated ALT measurement Discussed labs with patient today. Mild elevation of ALT for the first time.  Differential includes fatty liver, use of Tylenol, ETOH, or other medication use.   4. Vitamin D deficiency Vitamin D level low, normal 39.   5. Other depression,with emotional eating  PHQ-9:14 last visit with known allergy to Wellbutrin.  Has a good support system.    Assessment/Plan:   1. Essential hypertension Discontinue Amlodipine.    Begin - losartan-hydrochlorothiazide (HYZAAR) 50-12.5 MG tablet; Take 1 tablet by mouth daily.  Dispense: 30 tablet; Refill: 0  2. Secondary amenorrhea Check labs today.   - FSH/LH  3. Elevated ALT measurement Recheck LFT's in 3 months.   4. Vitamin D deficiency Begin OTC Vitamin D, 2,000 IU daily.   5. Other depression,with emotional eating  Continue to work on self care, stress  reduction, mindful eating.   6. Obesity,Current BMI 30.3 Eating out guide given.  Meat alternative hand out given.   Jillian Ford is currently in the action stage of change. As such, her goal is to continue with weight loss efforts. She has agreed to Category 3 Plan and keeping a food journal and adhering to recommended goals of 1500 calories and 90 grams of protein daily  Exercise goals:  discussed increasing walking time during cold weather months.   Behavioral modification strategies: increasing lean protein intake, increasing vegetables, increasing water intake, increasing high fiber foods, decreasing eating out, no skipping meals, keeping healthy foods in the home, better snacking choices, and decreasing junk food.  Jillian Ford has agreed to follow-up with our clinic in 3-4 weeks. She was informed of the importance of frequent follow-up visits to maximize her success with intensive lifestyle modifications for her multiple health conditions.   Objective:   Blood pressure (!) 140/84, pulse 85, temperature 98.1 F (36.7 C), height 5\' 4"  (1.626 m), weight 176 lb (79.8 kg), SpO2 99 %. Body mass index is 30.21 kg/m.  General: Cooperative, alert, well developed, in no acute distress. HEENT: Conjunctivae and lids unremarkable. Cardiovascular: Regular rhythm.  Lungs: Normal work of breathing. Neurologic: No focal deficits.   Lab Results  Component Value Date   CREATININE 0.65 02/28/2022   BUN 11 02/28/2022   NA 139 02/28/2022   K 4.0 02/28/2022   CL 102 02/28/2022   CO2 23 02/28/2022   Lab Results  Component Value Date   ALT 38 (H) 02/28/2022   AST 20 02/28/2022   ALKPHOS 113 02/28/2022   BILITOT 0.6 02/28/2022   Lab Results  Component Value Date   HGBA1C 5.4 02/28/2022   Lab Results  Component Value Date   INSULIN 9.9 02/28/2022   Lab Results  Component Value Date   TSH 1.580 02/28/2022   Lab Results  Component Value Date   CHOL 115 02/28/2022   HDL 82 02/28/2022   LDLCALC  23 02/28/2022   TRIG 31 02/28/2022   CHOLHDL 1.4 02/28/2022   Lab Results  Component Value Date   VD25OH 78.8 04/01/2019   VD25OH 47.5 06/10/2017   VD25OH 35 08/11/2014   Lab Results  Component Value Date   WBC 8.6 02/28/2022   HGB 15.9 02/28/2022   HCT 46.8 (H) 02/28/2022   MCV 91 02/28/2022   PLT 306 02/28/2022   Lab Results  Component Value Date   IRON 102 06/13/2020   FERRITIN 122 06/13/2020   Attestation Statements:   Reviewed by clinician on day of visit: allergies, medications, problem list, medical history, surgical history, family history, social history, and previous encounter notes.  I, Malcolm Metro, am acting as Energy manager for Seymour Bars, DO.  I have reviewed the above documentation for accuracy and completeness, and I agree with the above. Glennis Brink, DO

## 2022-03-26 ENCOUNTER — Encounter: Payer: Self-pay | Admitting: Nurse Practitioner

## 2022-03-26 ENCOUNTER — Ambulatory Visit: Payer: 59 | Admitting: Nurse Practitioner

## 2022-03-26 VITALS — BP 128/78 | Temp 99.9°F | Ht 64.0 in | Wt 176.0 lb

## 2022-03-26 DIAGNOSIS — J019 Acute sinusitis, unspecified: Secondary | ICD-10-CM | POA: Diagnosis not present

## 2022-03-26 MED ORDER — AMOXICILLIN 875 MG PO TABS: 875.0000 mg | ORAL_TABLET | Freq: Two times a day (BID) | ORAL | 0 refills | Status: AC

## 2022-03-26 NOTE — Progress Notes (Signed)
Subjective:    Patient ID: Jillian Ford, female    DOB: [birth year], 51 y.o.   MRN: [id number]  HPI Patient presents to clinic today for cough x7 days.  Patient describes cough as nonproductive.  Patient also complains of nasal congestion, runny nose, headache, and sinus pressure.  Patient denies any shortness of breath, fever, body aches, chills.   Review of Systems  HENT:  Positive for congestion, rhinorrhea and sinus pressure.   Respiratory:  Positive for cough.   Neurological:  Positive for headaches.  All other systems reviewed and are negative.      Objective:   Physical Exam Vitals reviewed.  Constitutional:      General: She is not in acute distress.    Appearance: Normal appearance. She is normal weight. She is not ill-appearing, toxic-appearing or diaphoretic.  HENT:     Head: Normocephalic and atraumatic.     Right Ear: Tympanic membrane, ear canal and external ear normal. There is no impacted cerumen.     Left Ear: Tympanic membrane, ear canal and external ear normal. There is no impacted cerumen.     Nose: Nose normal. No congestion or rhinorrhea.     Mouth/Throat:     Mouth: Mucous membranes are moist.     Pharynx: Oropharynx is clear. No oropharyngeal exudate or posterior oropharyngeal erythema.  Eyes:     General: No scleral icterus.       Right eye: No discharge.        Left eye: No discharge.     Pupils: Pupils are equal, round, and reactive to light.  Neck:     Vascular: No carotid bruit.  Cardiovascular:     Rate and Rhythm: Normal rate and regular rhythm.     Pulses: Normal pulses.     Heart sounds: Normal heart sounds. No murmur heard. Pulmonary:     Effort: Pulmonary effort is normal. No respiratory distress.     Breath sounds: Normal breath sounds. No wheezing.  Musculoskeletal:     Cervical back: Normal range of motion and neck supple. No rigidity or tenderness.     Comments: Grossly intact  Lymphadenopathy:     Cervical: Cervical adenopathy  present.  Skin:    General: Skin is warm.     Capillary Refill: Capillary refill takes less than 2 seconds.  Neurological:     Mental Status: She is alert.     Comments: Grossly intact  Psychiatric:        Mood and Affect: Mood normal.        Behavior: Behavior normal.           Assessment & Plan:   1. Acute rhinosinusitis -Bacterial rhinosinusitis versus viral -Due to symptoms lasting about 7 days we will go ahead and treat with amoxicillin however will also have COVID test pending -If COVID test positive we will have patient stop antibiotics - Novel Coronavirus, NAA ([facility]) - amoxicillin (AMOXIL) 875 MG tablet; Take 1 tablet (875 mg total) by mouth 2 (two) times daily for 7 days.  Dispense: 14 tablet; Refill: 0 -Return to clinic if symptoms worsen or do not improve to include shortness of breath difficulty breathing or headaches    Note:  This document was prepared using Dragon voice recognition software and may include unintentional dictation errors. Note - This record has been created using [employer].  Chart creation errors have been sought, but may not always  have been located. Such creation errors do not reflect on  the standard of medical care.

## 2022-03-27 LAB — NOVEL CORONAVIRUS, NAA: SARS-CoV-2, NAA: NOT DETECTED

## 2022-03-27 LAB — SPECIMEN STATUS REPORT

## 2022-04-02 ENCOUNTER — Encounter: Payer: Self-pay | Admitting: Nurse Practitioner

## 2022-04-03 ENCOUNTER — Other Ambulatory Visit: Payer: Self-pay | Admitting: Nurse Practitioner

## 2022-04-03 DIAGNOSIS — J019 Acute sinusitis, unspecified: Secondary | ICD-10-CM

## 2022-04-03 MED ORDER — AMOXICILLIN 875 MG PO TABS: 875.0000 mg | ORAL_TABLET | Freq: Two times a day (BID) | ORAL | 0 refills | Status: AC

## 2022-04-11 ENCOUNTER — Encounter (INDEPENDENT_AMBULATORY_CARE_PROVIDER_SITE_OTHER): Payer: Self-pay | Admitting: Family Medicine

## 2022-04-11 ENCOUNTER — Ambulatory Visit (INDEPENDENT_AMBULATORY_CARE_PROVIDER_SITE_OTHER): Payer: 59 | Admitting: Family Medicine

## 2022-04-11 VITALS — BP 128/86 | HR 81 | Temp 98.8°F | Ht 64.0 in

## 2022-04-11 DIAGNOSIS — E559 Vitamin D deficiency, unspecified: Secondary | ICD-10-CM | POA: Diagnosis not present

## 2022-04-11 DIAGNOSIS — I10 Essential (primary) hypertension: Secondary | ICD-10-CM | POA: Diagnosis not present

## 2022-04-11 DIAGNOSIS — N951 Menopausal and female climacteric states: Secondary | ICD-10-CM

## 2022-04-11 DIAGNOSIS — E669 Obesity, unspecified: Secondary | ICD-10-CM

## 2022-04-11 DIAGNOSIS — R7401 Elevation of levels of liver transaminase levels: Secondary | ICD-10-CM

## 2022-04-11 DIAGNOSIS — Z6829 Body mass index (BMI) 29.0-29.9, adult: Secondary | ICD-10-CM

## 2022-04-11 MED ORDER — LOSARTAN POTASSIUM-HCTZ 50-12.5 MG PO TABS: 1.0000 | ORAL_TABLET | Freq: Every day | ORAL | 0 refills | Status: DC

## 2022-04-12 DIAGNOSIS — I10 Essential (primary) hypertension: Secondary | ICD-10-CM | POA: Insufficient documentation

## 2022-04-12 DIAGNOSIS — N951 Menopausal and female climacteric states: Secondary | ICD-10-CM | POA: Insufficient documentation

## 2022-04-16 NOTE — Progress Notes (Unsigned)
Chief Complaint:   OBESITY Jillian Ford is here to discuss her progress with her obesity treatment plan along with follow-up of her obesity related diagnoses. Jillian Ford is on the Category 3 Plan and keeping a food journal and adhering to recommended goals of 1500 calories and 90 protein and states she is following her eating plan approximately 70% of the time. Jillian Ford states she is not exercising.   Today's visit was #: 3 Starting weight: 178 lbs Starting date: 02/28/2022 Today's weight: 173 lbs Today's date: 04/11/2022 Total lbs lost to date: 5 lbs Total lbs lost since last in-office visit: 3 lbs  Interim History: She has been sick with a cold in between visits.  She has been trying to stick to the meal plan.  She has seen an increase in sweets.  She denies cravings.  She is getting in protein with meals.  She has not been able to exercise.   Subjective:   1. Vitamin D deficiency She is on OTC Vitamin D 2,000 IU daily.    2. Perimenopausal Discussed labs with patient today. FSH and LH are in the premenopausal range. Having irregular menses.   3. Essential hypertension Blood pressure is well controlled today.   4. Elevated ALT measurement She is at risk for NAFLD.   Assessment/Plan:   1. Vitamin D deficiency Recheck Vitamin D level in 3 months.  Continue OTC Vitamin D.   2. Perimenopausal Reviewed labs with patient.   3. Essential hypertension Refill - losartan-hydrochlorothiazide (HYZAAR) 50-12.5 MG tablet; Take 1 tablet by mouth daily.  Dispense: 30 tablet; Refill: 0  4. Elevated ALT measurement Recheck hepatic function panel in 3 months.   5. Obesity, current BMI 29.8 She prefers the Category 3 meal plan over logging. She was given a handout for emotional eating.   Jillian Ford is currently in the action stage of change. As such, her goal is to continue with weight loss efforts. She has agreed to the Category 3 Plan.   Exercise goals: ***  Behavioral modification strategies:  increasing lean protein intake, increasing vegetables, increasing water intake, decreasing eating out, no skipping meals, meal planning and cooking strategies, and decreasing junk food.  Jillian Ford has agreed to follow-up with our clinic in 4 weeks. She was informed of the importance of frequent follow-up visits to maximize her success with intensive lifestyle modifications for her multiple health conditions.   Objective:   Blood pressure 128/86, pulse 81, temperature 98.8 F (37.1 C), height 5\' 4"  (1.626 m), SpO2 99 %. Body mass index is 30.21 kg/m.  General: Cooperative, alert, well developed, in no acute distress. HEENT: Conjunctivae and lids unremarkable. Cardiovascular: Regular rhythm.  Lungs: Normal work of breathing. Neurologic: No focal deficits.   Lab Results  Component Value Date   CREATININE 0.65 02/28/2022   BUN 11 02/28/2022   NA 139 02/28/2022   K 4.0 02/28/2022   CL 102 02/28/2022   CO2 23 02/28/2022   Lab Results  Component Value Date   ALT 38 (H) 02/28/2022   AST 20 02/28/2022   ALKPHOS 113 02/28/2022   BILITOT 0.6 02/28/2022   Lab Results  Component Value Date   HGBA1C 5.4 02/28/2022   Lab Results  Component Value Date   INSULIN 9.9 02/28/2022   Lab Results  Component Value Date   TSH 1.580 02/28/2022   Lab Results  Component Value Date   CHOL 115 02/28/2022   HDL 82 02/28/2022   LDLCALC 23 02/28/2022   TRIG 31 02/28/2022  CHOLHDL 1.4 02/28/2022   Lab Results  Component Value Date   VD25OH 78.8 04/01/2019   VD25OH 47.5 06/10/2017   VD25OH 35 08/11/2014   Lab Results  Component Value Date   WBC 8.6 02/28/2022   HGB 15.9 02/28/2022   HCT 46.8 (H) 02/28/2022   MCV 91 02/28/2022   PLT 306 02/28/2022   Lab Results  Component Value Date   IRON 102 06/13/2020   FERRITIN 122 06/13/2020    Attestation Statements:   Reviewed by clinician on day of visit: allergies, medications, problem list, medical history, surgical history, family  history, social history, and previous encounter notes.  I, Davy Pique, am acting as Location manager for Loyal Gambler, DO.  I have reviewed the above documentation for accuracy and completeness, and I agree with the above. -  ***

## 2022-04-23 ENCOUNTER — Ambulatory Visit: Payer: 59 | Admitting: Nurse Practitioner

## 2022-04-23 VITALS — BP 136/89 | Ht 64.0 in | Wt 179.8 lb

## 2022-04-23 DIAGNOSIS — R42 Dizziness and giddiness: Secondary | ICD-10-CM | POA: Diagnosis not present

## 2022-04-23 DIAGNOSIS — I10 Essential (primary) hypertension: Secondary | ICD-10-CM

## 2022-04-23 MED ORDER — LOSARTAN POTASSIUM-HCTZ 50-12.5 MG PO TABS
1.0000 | ORAL_TABLET | Freq: Every day | ORAL | 1 refills | Status: DC
Start: 2022-04-23 — End: 2022-08-24

## 2022-04-23 NOTE — Progress Notes (Signed)
   Subjective:    Patient ID: Jillian Ford, female    DOB: [birth year], 51 y.o.   MRN: [id number]  HPI  Patient arrives to discuss elevated blood pressure. Patient states she had a spell at work where she got dizzy at work and they called 911 and her blood pressure was very elevated and they advised she get checked out. Currently on losartan for BP.  Review of Systems     Objective:   Physical Exam        Assessment & Plan:

## 2022-04-24 ENCOUNTER — Encounter: Payer: Self-pay | Admitting: Nurse Practitioner

## 2022-04-24 LAB — CBC WITH DIFFERENTIAL/PLATELET
Basophils Absolute: 0.1 10*3/uL (ref 0.0–0.2)
Basos: 1 %
EOS (ABSOLUTE): 0.3 10*3/uL (ref 0.0–0.4)
Eos: 4 %
Hematocrit: 44.8 % (ref 34.0–46.6)
Hemoglobin: 15.5 g/dL (ref 11.1–15.9)
Immature Grans (Abs): 0 10*3/uL (ref 0.0–0.1)
Immature Granulocytes: 0 %
Lymphocytes Absolute: 2.2 10*3/uL (ref 0.7–3.1)
Lymphs: 30 %
MCH: 31.4 pg (ref 26.6–33.0)
MCHC: 34.6 g/dL (ref 31.5–35.7)
MCV: 91 fL (ref 79–97)
Monocytes Absolute: 0.5 10*3/uL (ref 0.1–0.9)
Monocytes: 7 %
Neutrophils Absolute: 4.2 10*3/uL (ref 1.4–7.0)
Neutrophils: 58 %
Platelets: 330 10*3/uL (ref 150–450)
RBC: 4.94 x10E6/uL (ref 3.77–5.28)
RDW: 12.1 % (ref 11.7–15.4)
WBC: 7.3 10*3/uL (ref 3.4–10.8)

## 2022-04-24 LAB — CMP14+EGFR
ALT: 26 IU/L (ref 0–32)
AST: 17 IU/L (ref 0–40)
Albumin/Globulin Ratio: 2 (ref 1.2–2.2)
Albumin: 4.4 g/dL (ref 3.8–4.9)
Alkaline Phosphatase: 109 IU/L (ref 44–121)
BUN/Creatinine Ratio: 19 (ref 9–23)
BUN: 13 mg/dL (ref 6–24)
Bilirubin Total: 0.3 mg/dL (ref 0.0–1.2)
CO2: 23 mmol/L (ref 20–29)
Calcium: 9.1 mg/dL (ref 8.7–10.2)
Chloride: 103 mmol/L (ref 96–106)
Creatinine, Ser: 0.7 mg/dL (ref 0.57–1.00)
Globulin, Total: 2.2 g/dL (ref 1.5–4.5)
Glucose: 95 mg/dL (ref 70–99)
Potassium: 3.8 mmol/L (ref 3.5–5.2)
Sodium: 141 mmol/L (ref 134–144)
Total Protein: 6.6 g/dL (ref 6.0–8.5)
eGFR: 105 mL/min/{1.73_m2} (ref >59)

## 2022-05-03 ENCOUNTER — Ambulatory Visit (INDEPENDENT_AMBULATORY_CARE_PROVIDER_SITE_OTHER): Payer: 59 | Admitting: *Deleted

## 2022-05-03 DIAGNOSIS — Z23 Encounter for immunization: Secondary | ICD-10-CM | POA: Diagnosis not present

## 2022-05-15 ENCOUNTER — Ambulatory Visit (INDEPENDENT_AMBULATORY_CARE_PROVIDER_SITE_OTHER): Payer: 59 | Admitting: Family Medicine

## 2022-05-15 ENCOUNTER — Encounter (INDEPENDENT_AMBULATORY_CARE_PROVIDER_SITE_OTHER): Payer: Self-pay | Admitting: Family Medicine

## 2022-05-15 VITALS — BP 115/83 | HR 88 | Temp 98.5°F | Ht 64.0 in | Wt 174.0 lb

## 2022-05-15 DIAGNOSIS — I10 Essential (primary) hypertension: Secondary | ICD-10-CM

## 2022-05-15 DIAGNOSIS — R632 Polyphagia: Secondary | ICD-10-CM

## 2022-05-15 DIAGNOSIS — E669 Obesity, unspecified: Secondary | ICD-10-CM | POA: Diagnosis not present

## 2022-05-15 DIAGNOSIS — R7401 Elevation of levels of liver transaminase levels: Secondary | ICD-10-CM | POA: Diagnosis not present

## 2022-05-15 DIAGNOSIS — Z6829 Body mass index (BMI) 29.0-29.9, adult: Secondary | ICD-10-CM

## 2022-05-15 MED ORDER — TOPIRAMATE 25 MG PO TABS: 25.0000 mg | ORAL_TABLET | Freq: Two times a day (BID) | ORAL | 0 refills | Status: DC

## 2022-05-16 DIAGNOSIS — R632 Polyphagia: Secondary | ICD-10-CM | POA: Insufficient documentation

## 2022-05-21 ENCOUNTER — Ambulatory Visit: Payer: 59 | Admitting: Nurse Practitioner

## 2022-05-28 NOTE — Progress Notes (Unsigned)
Chief Complaint:   OBESITY Jillian Ford is here to discuss her progress with her obesity treatment plan along with follow-up of her obesity related diagnoses. Jillian Ford is on the Category 3 Plan and states she is following her eating plan approximately 80% of the time. Jillian Ford states she is walking 3 miles 3 times per week.  Today's visit was #: 4 Starting weight: 178 lbs Starting date: 02/28/2022 Today's weight: 174 lbs Today's date: 05/15/2022 Total lbs lost to date: 4 lbs Total lbs lost since last in-office visit: +1 lb  Interim History: Had EMS called in early October.  She felt shaky, sick and dizzy.  Presented with vertigo.  Her episode did not last long.  Has had some cravings.  Gets off track on the weekends.  Walking 3 times per week.  Sleeping well.   Subjective:   1. Elevated ALT measurement ALT improved from 38 to 26.  Back down to normal.   2. Essential hypertension Blood pressure well controlled on Losartan-HCTZ 50/12.5 mg daily.   3. Polyphagia Has a history of Wellbutrin allergy.  Will increase fiber and water intake.   Assessment/Plan:   1. Elevated ALT measurement Further labs not needed.   2. Essential hypertension Continue current medications and follow up with PCP.   3. Polyphagia Begin - topiramate (TOPAMAX) 25 MG tablet; Take 1 tablet (25 mg total) by mouth 2 (two) times daily.  Dispense: 60 tablet; Refill: 0  4. Obesity, current BMI 29.9 Add one serving of complex carbohydrate with dinner. (1/4 plate)  Jillian Ford is currently in the action stage of change. As such, her goal is to continue with weight loss efforts. She has agreed to the Category 3 Plan.   Exercise goals:  As is.   Behavioral modification strategies: increasing lean protein intake, increasing vegetables, increasing water intake, decreasing eating out, no skipping meals, meal planning and cooking strategies, keeping healthy foods in the home, and decreasing junk food.  Jillian Ford has agreed to  follow-up with our clinic in 3-4 weeks. She was informed of the importance of frequent follow-up visits to maximize her success with intensive lifestyle modifications for her multiple health conditions.   Objective:   Blood pressure 115/83, pulse 88, temperature 98.5 F (36.9 C), height 5\' 4"  (1.626 m), weight 174 lb (78.9 kg), SpO2 98 %. Body mass index is 29.87 kg/m.  General: Cooperative, alert, well developed, in no acute distress. HEENT: Conjunctivae and lids unremarkable. Cardiovascular: Regular rhythm.  Lungs: Normal work of breathing. Neurologic: No focal deficits.   Lab Results  Component Value Date   CREATININE 0.70 04/23/2022   BUN 13 04/23/2022   NA 141 04/23/2022   K 3.8 04/23/2022   CL 103 04/23/2022   CO2 23 04/23/2022   Lab Results  Component Value Date   ALT 26 04/23/2022   AST 17 04/23/2022   ALKPHOS 109 04/23/2022   BILITOT 0.3 04/23/2022   Lab Results  Component Value Date   HGBA1C 5.4 02/28/2022   Lab Results  Component Value Date   INSULIN 9.9 02/28/2022   Lab Results  Component Value Date   TSH 1.580 02/28/2022   Lab Results  Component Value Date   CHOL 115 02/28/2022   HDL 82 02/28/2022   LDLCALC 23 02/28/2022   TRIG 31 02/28/2022   CHOLHDL 1.4 02/28/2022   Lab Results  Component Value Date   VD25OH 78.8 04/01/2019   VD25OH 47.5 06/10/2017   VD25OH 35 08/11/2014   Lab Results  Component Value Date   WBC 7.3 04/23/2022   HGB 15.5 04/23/2022   HCT 44.8 04/23/2022   MCV 91 04/23/2022   PLT 330 04/23/2022   Lab Results  Component Value Date   IRON 102 06/13/2020   FERRITIN 122 06/13/2020    Attestation Statements:   Reviewed by clinician on day of visit: allergies, medications, problem list, medical history, surgical history, family history, social history, and previous encounter notes.  I, Malcolm Metro, am acting as Energy manager for Seymour Bars, DO.  I have reviewed the above documentation for accuracy and  completeness, and I agree with the above. Glennis Brink, DO

## 2022-06-12 ENCOUNTER — Encounter (INDEPENDENT_AMBULATORY_CARE_PROVIDER_SITE_OTHER): Payer: Self-pay | Admitting: Family Medicine

## 2022-06-12 ENCOUNTER — Ambulatory Visit (INDEPENDENT_AMBULATORY_CARE_PROVIDER_SITE_OTHER): Payer: 59 | Admitting: Family Medicine

## 2022-06-12 VITALS — BP 121/81 | HR 67 | Temp 98.5°F | Ht 64.0 in | Wt 171.0 lb

## 2022-06-12 DIAGNOSIS — E559 Vitamin D deficiency, unspecified: Secondary | ICD-10-CM

## 2022-06-12 DIAGNOSIS — Z6829 Body mass index (BMI) 29.0-29.9, adult: Secondary | ICD-10-CM

## 2022-06-12 DIAGNOSIS — R632 Polyphagia: Secondary | ICD-10-CM | POA: Diagnosis not present

## 2022-06-12 DIAGNOSIS — E669 Obesity, unspecified: Secondary | ICD-10-CM | POA: Diagnosis not present

## 2022-06-12 MED ORDER — TOPIRAMATE 25 MG PO TABS: 25.0000 mg | ORAL_TABLET | Freq: Two times a day (BID) | ORAL | 1 refills | Status: DC

## 2022-06-21 NOTE — Progress Notes (Signed)
Chief Complaint:   OBESITY Jillian Ford is here to discuss her progress with her obesity treatment plan along with follow-up of her obesity related diagnoses. Jillian Ford is on the Category 3 Plan and states she is following her eating plan approximately 80% of the time. Jillian Ford states she is walking 3 miles 20-30 walker minutes 3 times per week.  Today's visit was #: 5 Starting weight: 178 LBS Starting date: 02/28/2022 Today's weight: 171 LBS Today's date: 06/12/2022 Total lbs lost to date: 7 LBS Total lbs lost since last in-office visit: 3 LBS  Interim History: Patient celebrated Thanksgiving and had a meal at [employer].  Family has been supportive.  She is going to an all-inclusive resort to [place] in December.  She is sometimes skipping supper, but keeps deli Jillian Ford, Jillian Ford yogurt, and a protein shake on hand.  Subjective:   1. Polyphagia She is improving on Topamax 25 mg twice daily.  She has some dysgeusia, but no other side effects.  2. Vitamin D deficiency She is currently taking OTC vitamin D 2,000 IU each day. She denies nausea, vomiting or muscle weakness.  Her last vitamin D level was 39, on 02/28/2022.  Assessment/Plan:   1. Polyphagia Refill- topiramate (TOPAMAX) 25 MG tablet; Take 1 tablet (25 mg total) by mouth 2 (two) times daily.  Dispense: 60 tablet; Refill: 1  2. Vitamin D deficiency Continue OTC vitamin D 2,000 IU daily.  3. Obesity, current BMI 29.5 1) increase water intake to 64 ounces per day. 2) discussed when to plan for exercise. 3) resistance bands given.  Jillian Ford is currently in the action stage of change. As such, her goal is to continue with weight loss efforts. She has agreed to the Category 3 Plan.   Exercise goals:  As is.  Behavioral modification strategies: increasing lean protein intake, increasing water intake, decreasing liquid calories, decreasing alcohol intake, decreasing eating out, meal planning and cooking strategies, keeping healthy foods  in the home, travel eating strategies, and planning for success.  Jillian Ford has agreed to follow-up with our clinic in 4 weeks. She was informed of the importance of frequent follow-up visits to maximize her success with intensive lifestyle modifications for her multiple health conditions.   Objective:   Blood pressure 121/81, pulse 67, temperature 98.5 F (36.9 C), height 5\' 4"  (1.626 m), weight 171 lb (77.6 kg), SpO2 98 %. Body mass index is 29.35 kg/m.  General: Cooperative, alert, well developed, in no acute distress. HEENT: Conjunctivae and lids unremarkable. Cardiovascular: Regular rhythm.  Lungs: Normal work of breathing. Neurologic: No focal deficits.   Lab Results  Component Value Date   CREATININE 0.70 04/23/2022   BUN 13 04/23/2022   NA 141 04/23/2022   K 3.8 04/23/2022   CL 103 04/23/2022   CO2 23 04/23/2022   Lab Results  Component Value Date   ALT 26 04/23/2022   AST 17 04/23/2022   ALKPHOS 109 04/23/2022   BILITOT 0.3 04/23/2022   Lab Results  Component Value Date   HGBA1C 5.4 02/28/2022   Lab Results  Component Value Date   INSULIN 9.9 02/28/2022   Lab Results  Component Value Date   TSH 1.580 02/28/2022   Lab Results  Component Value Date   CHOL 115 02/28/2022   HDL 82 02/28/2022   LDLCALC 23 02/28/2022   TRIG 31 02/28/2022   CHOLHDL 1.4 02/28/2022   Lab Results  Component Value Date   VD25OH 78.8 04/01/2019   VD25OH 47.5 06/10/2017  VD25OH 35 08/11/2014   Lab Results  Component Value Date   WBC 7.3 04/23/2022   HGB 15.5 04/23/2022   HCT 44.8 04/23/2022   MCV 91 04/23/2022   PLT 330 04/23/2022   Lab Results  Component Value Date   IRON 102 06/13/2020   FERRITIN 122 06/13/2020   Attestation Statements:   Reviewed by clinician on day of visit: allergies, medications, problem list, medical history, surgical history, family history, social history, and previous encounter notes.  I, Malcolm Metro, am acting as Energy manager for  Seymour Bars, DO.  I have reviewed the above documentation for accuracy and completeness, and I agree with the above. Glennis Brink, DO

## 2022-07-17 ENCOUNTER — Ambulatory Visit (INDEPENDENT_AMBULATORY_CARE_PROVIDER_SITE_OTHER): Payer: 59 | Admitting: Family Medicine

## 2022-07-24 ENCOUNTER — Encounter (INDEPENDENT_AMBULATORY_CARE_PROVIDER_SITE_OTHER): Payer: Self-pay | Admitting: Family Medicine

## 2022-07-24 ENCOUNTER — Encounter (INDEPENDENT_AMBULATORY_CARE_PROVIDER_SITE_OTHER): Payer: Self-pay

## 2022-07-24 ENCOUNTER — Ambulatory Visit (INDEPENDENT_AMBULATORY_CARE_PROVIDER_SITE_OTHER): Payer: 59 | Admitting: Family Medicine

## 2022-07-24 VITALS — BP 121/82 | HR 80 | Temp 98.6°F | Ht 64.0 in | Wt 171.0 lb

## 2022-07-24 DIAGNOSIS — E669 Obesity, unspecified: Secondary | ICD-10-CM

## 2022-07-24 DIAGNOSIS — R632 Polyphagia: Secondary | ICD-10-CM

## 2022-07-24 DIAGNOSIS — E559 Vitamin D deficiency, unspecified: Secondary | ICD-10-CM

## 2022-07-24 DIAGNOSIS — Z6829 Body mass index (BMI) 29.0-29.9, adult: Secondary | ICD-10-CM | POA: Diagnosis not present

## 2022-07-24 MED ORDER — TOPIRAMATE 25 MG PO TABS: 25.0000 mg | ORAL_TABLET | Freq: Two times a day (BID) | ORAL | 0 refills | Status: DC

## 2022-08-13 NOTE — Progress Notes (Unsigned)
Chief Complaint:   OBESITY Jillian Ford is here to discuss her progress with her obesity treatment plan along with follow-up of her obesity related diagnoses. Jillian Ford is on the Category 3 Plan and states she is following her eating plan approximately 65% of the time. Jillian Ford states she is walking for 20-30 minutes 1 time per week.  Today's visit was #: 6 Starting weight: 178 lbs Starting date: 02/28/22 Today's weight: 171 lbs Today's date: 07/24/22 Total lbs lost to date: 7 Total lbs lost since last in-office visit: 0  Interim History: Went to [place] for a week in December.  Stayed mindful through the holidays.  Has no hunger cravings while eating on meal plan. Lost 0.8 pounds of muscle mass.  Gained 1 pound of body fat in past 4 weeks.  Subjective:   1. Polyphagia Improving on Topamax 25 mg twice daily for food impulse control. Has side effect of dysgeusia. Denies paresthesias.  2. Vitamin D deficiency Last vitamin D level 39 on 02/28/2022. Has not been taking over-the-counter vitamin D 2000 IU daily.  Assessment/Plan:   1. Polyphagia Refilled: - topiramate (TOPAMAX) 25 MG tablet; Take 1 tablet (25 mg total) by mouth 2 (two) times daily.  Dispense: 60 tablet; Refill: 0  2. Vitamin D deficiency Restart OTC vitamin D 4000 IU daily. Recheck vitamin D level in 2 months.  3. Obesity,current BMI 29.5 1.  Resume category 3 meal plan 2.  Can substitute bread with low sugar oatmeal with breakfast. 3.  Can substitute Progresso light soup with greater than 20 g of protein at lunch.  Jillian Ford is currently in the action stage of change. As such, her goal is to continue with weight loss efforts. She has agreed to the Category 3 Plan.   Exercise goals: Add in weight training 2 times per week.  Behavioral modification strategies: increasing lean protein intake, increasing water intake, decreasing liquid calories, decreasing eating out, no skipping meals, meal planning and cooking strategies,  keeping healthy foods in the home, and decreasing junk food.  Jillian Ford has agreed to follow-up with our clinic in 4-5 weeks. She was informed of the importance of frequent follow-up visits to maximize her success with intensive lifestyle modifications for her multiple health conditions.    Objective:   Blood pressure 121/82, pulse 80, temperature 98.6 F (37 C), height 5\' 4"  (1.626 m), weight 171 lb (77.6 kg), SpO2 99 %. Body mass index is 29.35 kg/m.  General: Cooperative, alert, well developed, in no acute distress. HEENT: Conjunctivae and lids unremarkable. Cardiovascular: Regular rhythm.  Lungs: Normal work of breathing. Neurologic: No focal deficits.   Lab Results  Component Value Date   CREATININE 0.70 04/23/2022   BUN 13 04/23/2022   NA 141 04/23/2022   K 3.8 04/23/2022   CL 103 04/23/2022   CO2 23 04/23/2022   Lab Results  Component Value Date   ALT 26 04/23/2022   AST 17 04/23/2022   ALKPHOS 109 04/23/2022   BILITOT 0.3 04/23/2022   Lab Results  Component Value Date   HGBA1C 5.4 02/28/2022   Lab Results  Component Value Date   INSULIN 9.9 02/28/2022   Lab Results  Component Value Date   TSH 1.580 02/28/2022   Lab Results  Component Value Date   CHOL 115 02/28/2022   HDL 82 02/28/2022   LDLCALC 23 02/28/2022   TRIG 31 02/28/2022   CHOLHDL 1.4 02/28/2022   Lab Results  Component Value Date   VD25OH 78.8 04/01/2019  VD25OH 47.5 06/10/2017   VD25OH 35 08/11/2014   Lab Results  Component Value Date   WBC 7.3 04/23/2022   HGB 15.5 04/23/2022   HCT 44.8 04/23/2022   MCV 91 04/23/2022   PLT 330 04/23/2022   Lab Results  Component Value Date   IRON 102 06/13/2020   FERRITIN 122 06/13/2020    Attestation Statements:   Reviewed by clinician on day of visit: allergies, medications, problem list, medical history, surgical history, family history, social history, and previous encounter notes.  I, Jillian Fick, FNP, am acting as  transcriptionist for Dr. Loyal Gambler.  I have reviewed the above documentation for accuracy and completeness, and I agree with the above. Jillian Ponto, DO

## 2022-08-17 ENCOUNTER — Encounter (INDEPENDENT_AMBULATORY_CARE_PROVIDER_SITE_OTHER): Payer: Self-pay

## 2022-08-17 DIAGNOSIS — I10 Essential (primary) hypertension: Secondary | ICD-10-CM

## 2022-08-23 NOTE — Telephone Encounter (Signed)
Nurses You may send in 90 days on her losartan HCTZ I would like for her to schedule follow-up visit with myself within those 90 days it would be a good idea for her to go ahead and schedule thanks-Dr. Sallee Lange

## 2022-08-24 MED ORDER — LOSARTAN POTASSIUM-HCTZ 50-12.5 MG PO TABS: 1.0000 | ORAL_TABLET | Freq: Every day | ORAL | 0 refills | Status: DC

## 2022-08-28 ENCOUNTER — Ambulatory Visit (INDEPENDENT_AMBULATORY_CARE_PROVIDER_SITE_OTHER): Payer: 59 | Admitting: Family Medicine

## 2022-08-28 ENCOUNTER — Encounter (INDEPENDENT_AMBULATORY_CARE_PROVIDER_SITE_OTHER): Payer: Self-pay | Admitting: Family Medicine

## 2022-08-28 VITALS — BP 114/77 | HR 79 | Temp 98.5°F | Ht 64.0 in | Wt 168.0 lb

## 2022-08-28 DIAGNOSIS — R632 Polyphagia: Secondary | ICD-10-CM | POA: Diagnosis not present

## 2022-08-28 DIAGNOSIS — Z6828 Body mass index (BMI) 28.0-28.9, adult: Secondary | ICD-10-CM

## 2022-08-28 DIAGNOSIS — E669 Obesity, unspecified: Secondary | ICD-10-CM | POA: Insufficient documentation

## 2022-08-28 DIAGNOSIS — E559 Vitamin D deficiency, unspecified: Secondary | ICD-10-CM | POA: Diagnosis not present

## 2022-08-28 MED ORDER — TOPIRAMATE 50 MG PO TABS: 50.0000 mg | ORAL_TABLET | Freq: Every day | ORAL | 0 refills | Status: DC

## 2022-08-28 NOTE — Assessment & Plan Note (Signed)
Improving on topiramate 25 mg twice daily though forgetting to take the evening dose at times.  Denies adverse side effects other than dysgeusia.  Will change her topiramate to 50 mg once daily in the morning.  New prescription sent for topiramate 50 mg tab once daily #30, 0 refills.

## 2022-08-28 NOTE — Assessment & Plan Note (Signed)
Taking over-the-counter vitamin D 4000 IU once daily.  Last vitamin D level 8/16 at 39.  Target vitamin D level is 50-70.  Denies fatigue.  Plan to recheck vitamin D levels in 1 to 2 months.

## 2022-08-28 NOTE — Progress Notes (Signed)
Office: [phone]  /  Fax: [phone]  WEIGHT SUMMARY AND BIOMETRICS  Medical Weight Loss Height: 5' 4"$  (1.626 m) Weight: 168 lb (76.2 kg) Temp: 98.5 F (36.9 C) Pulse Rate: 79 BP: 114/77 SpO2: 99 % Fasting: no Labs: no Today's Visit #: 7 Weight at Last VIsit: 171lb Weight Lost Since Last Visit: 3lb  Body Fat %: 37.4 % Fat Mass (lbs): 62.8 lbs Muscle Mass (lbs): 99.8 lbs Total Body Water (lbs): 67.2 lbs Visceral Fat Rating : 8 Starting Date: 02/28/22 Starting Weight: 178lb Total Weight Loss (lbs): 10 lb (4.536 kg)    HPI  Chief Complaint: OBESITY  Jillian Ford is here to discuss her progress with her obesity treatment plan. She is on the the Category 3 Plan and states she is following her eating plan approximately 80 % of the time. She states she is walking for 45-60 minutes 2-3 times per week.   Interval History:  Since last office visit she is down 3 lbs.  This gives her a net weight loss of 10 lb.  This is a 5.6% TBW loss.  She is doing fairly well on meal plan.  Husband continues to bring home junk food.  She has added in some outdoor walking and plans to increase to 4 days/ wk.   Pharmacotherapy: Topiramate 25 mg bid  PHYSICAL EXAM:  Blood pressure 114/77, pulse 79, temperature 98.5 F (36.9 C), height 5' 4"$  (1.626 m), weight 168 lb (76.2 kg), SpO2 99 %. Body mass index is 28.84 kg/m.  General: She is overweight, cooperative, alert, well developed, and in no acute distress. PSYCH: Has normal mood, affect and thought process.   HEENT: EOMI, sclerae are anicteric. Lungs: Normal breathing effort, no conversational dyspnea. Extremities: No edema.  Neurologic: No gross sensory or motor deficits. No tremors or fasciculations noted.    DIAGNOSTIC DATA REVIEWED:  BMET    Component Value Date/Time   NA 141 04/23/2022 1119   K 3.8 04/23/2022 1119   CL 103 04/23/2022 1119   CO2 23 04/23/2022 1119   GLUCOSE 95 04/23/2022 1119   GLUCOSE 133 (H) 03/14/2016  0024   BUN 13 04/23/2022 1119   CREATININE 0.70 04/23/2022 1119   CREATININE 0.68 08/11/2014 1032   CALCIUM 9.1 04/23/2022 1119   GFRNONAA 91 06/13/2020 0810   GFRAA 105 06/13/2020 0810   Lab Results  Component Value Date   HGBA1C 5.4 02/28/2022   Lab Results  Component Value Date   INSULIN 9.9 02/28/2022   Lab Results  Component Value Date   TSH 1.580 02/28/2022   CBC    Component Value Date/Time   WBC 7.3 04/23/2022 1119   WBC 18.3 (H) 03/14/2016 0024   RBC 4.94 04/23/2022 1119   RBC 4.81 03/14/2016 0024   HGB 15.5 04/23/2022 1119   HCT 44.8 04/23/2022 1119   PLT 330 04/23/2022 1119   MCV 91 04/23/2022 1119   MCH 31.4 04/23/2022 1119   MCH 31.6 03/14/2016 0024   MCHC 34.6 04/23/2022 1119   MCHC 34.1 03/14/2016 0024   RDW 12.1 04/23/2022 1119   Iron Studies    Component Value Date/Time   IRON 102 06/13/2020 0810   FERRITIN 122 06/13/2020 0810   Lipid Panel     Component Value Date/Time   CHOL 115 02/28/2022 0929   TRIG 31 02/28/2022 0929   HDL 82 02/28/2022 0929   CHOLHDL 1.4 02/28/2022 0929   CHOLHDL 1.5 08/11/2014 1032   VLDL 8 08/11/2014 1032   LDLCALC 23  02/28/2022 0929   Hepatic Function Panel     Component Value Date/Time   PROT 6.6 04/23/2022 1119   ALBUMIN 4.4 04/23/2022 1119   AST 17 04/23/2022 1119   ALT 26 04/23/2022 1119   ALKPHOS 109 04/23/2022 1119   BILITOT 0.3 04/23/2022 1119   BILIDIR 0.17 06/10/2017 0859   IBILI 0.4 08/11/2014 1032      Component Value Date/Time   TSH 1.580 02/28/2022 0929   TSH 1.910 06/13/2020 0810   Nutritional Lab Results  Component Value Date   VD25OH 78.8 04/01/2019   VD25OH 47.5 06/10/2017   VD25OH 35 08/11/2014     ASSESSMENT AND PLAN  TREATMENT PLAN FOR OBESITY:  Recommended Dietary Goals  Lourene is currently in the action stage of change. As such, her goal is to continue weight management plan. She has agreed to the Category 3 Plan.  Behavioral Intervention  We discussed the  following Behavioral Modification Strategies today: increasing lean protein intake, increasing vegetables, increase water intake, work on meal planning and easy cooking plans, and think about ways to increase physical activity.  Additional resources provided today: NA  Recommended Physical Activity Goals  Renlee has been advised to work up to 150 minutes of moderate intensity aerobic activity a week and strengthening exercises 2-3 times per week for cardiovascular health, weight loss maintenance and preservation of muscle mass.   She has agreed to Patient also encouraged on scheduling and tracking physical activity.    Pharmacotherapy We discussed various medication options to help Khamari with her weight loss efforts and we both agreed to continuing .  ASSOCIATED CONDITIONS ADDRESSED TODAY  Polyphagia Assessment & Plan: Improving on topiramate 25 mg twice daily though forgetting to take the evening dose at times.  Denies adverse side effects other than dysgeusia.  Will change her topiramate to 50 mg once daily in the morning.  New prescription sent for topiramate 50 mg tab once daily #30, 0 refills.   Orders: -     Topiramate; Take 1 tablet (50 mg total) by mouth daily.  Dispense: 30 tablet; Refill: 0  Obesity,current BMI 28.8  Generalized obesity  Vitamin D deficiency Assessment & Plan: Taking over-the-counter vitamin D 4000 IU once daily.  Last vitamin D level 8/16 at 39.  Target vitamin D level is 50-70.  Denies fatigue.  Plan to recheck vitamin D levels in 1 to 2 months.       No follow-ups on file.Marland Kitchen She was informed of the importance of frequent follow up visits to maximize her success with intensive lifestyle modifications for her multiple health conditions.   ATTESTASTION STATEMENTS:  Reviewed by clinician on day of visit: allergies, medications, problem list, medical history, surgical history, family history, social history, and previous encounter notes.   Time spent  on visit including pre-visit chart review and post-visit care and charting was 30 minutes.    Dell Ponto, DO

## 2022-09-07 ENCOUNTER — Encounter: Payer: Self-pay | Admitting: Adult Health

## 2022-09-07 ENCOUNTER — Ambulatory Visit (INDEPENDENT_AMBULATORY_CARE_PROVIDER_SITE_OTHER): Payer: 59 | Admitting: Adult Health

## 2022-09-07 ENCOUNTER — Other Ambulatory Visit (HOSPITAL_COMMUNITY)
Admission: RE | Admit: 2022-09-07 | Discharge: 2022-09-07 | Disposition: A | Discharge status: Home / Self Care | Payer: 59 | Source: Ambulatory Visit | Attending: Adult Health | Admitting: Adult Health

## 2022-09-07 VITALS — BP 116/75 | HR 84 | Ht 64.0 in | Wt 171.0 lb

## 2022-09-07 DIAGNOSIS — Z01419 Encounter for gynecological examination (general) (routine) without abnormal findings: Secondary | ICD-10-CM | POA: Insufficient documentation

## 2022-09-07 DIAGNOSIS — Z3202 Encounter for pregnancy test, result negative: Secondary | ICD-10-CM | POA: Diagnosis not present

## 2022-09-07 DIAGNOSIS — Z1231 Encounter for screening mammogram for malignant neoplasm of breast: Secondary | ICD-10-CM | POA: Insufficient documentation

## 2022-09-07 DIAGNOSIS — R232 Flushing: Secondary | ICD-10-CM

## 2022-09-07 DIAGNOSIS — Z1211 Encounter for screening for malignant neoplasm of colon: Secondary | ICD-10-CM | POA: Diagnosis not present

## 2022-09-07 DIAGNOSIS — N951 Menopausal and female climacteric states: Secondary | ICD-10-CM

## 2022-09-07 DIAGNOSIS — Z30011 Encounter for initial prescription of contraceptive pills: Secondary | ICD-10-CM | POA: Insufficient documentation

## 2022-09-07 DIAGNOSIS — Z1212 Encounter for screening for malignant neoplasm of rectum: Secondary | ICD-10-CM

## 2022-09-07 DIAGNOSIS — N926 Irregular menstruation, unspecified: Secondary | ICD-10-CM

## 2022-09-07 LAB — HEMOCCULT GUIAC POC 1CARD (OFFICE): Fecal Occult Blood, POC: NEGATIVE

## 2022-09-07 LAB — POCT URINE PREGNANCY: Preg Test, Ur: NEGATIVE

## 2022-09-07 MED ORDER — LO LOESTRIN FE 1 MG-10 MCG / 10 MCG PO TABS: 1.0000 | ORAL_TABLET | Freq: Every day | ORAL | 0 refills | Status: DC

## 2022-09-07 NOTE — Progress Notes (Signed)
Patient ID: Jillian Ford, female   DOB: 08/21/1970, 52 y.o.   MRN: [id number] History of Present Illness: Jillian Ford is a 52 year old white female,married, in for a well woman gyn exam and pap and she wants birth control. Her periods are irregular and having some hot flashes. She has lost about 30 lbs, [facility] to [facility].  PCP is Dr Sallee Lange.   Current Medications, Allergies, Past Medical History, Past Surgical History, Family History and Social History were reviewed in [employer] record.     Review of Systems: Patient denies any headaches, hearing loss, fatigue, blurred vision, shortness of breath, chest pain, abdominal pain, problems with bowel movements, urination, or intercourse. No joint pain or mood swings. See HPI for positives.    Physical Exam:BP 116/75 (BP Location: Left Arm, Patient Position: Sitting, Cuff Size: Normal)   Pulse 84   Ht '5\' 4"'$  (1.626 m)   Wt 171 lb (77.6 kg)   LMP 08/24/2022 (Approximate)   BMI 29.35 kg/m  UPT is negative  General:  Well developed, well nourished, no acute distress Skin:  Warm and dry Neck:  Midline trachea, normal thyroid, good ROM, no lymphadenopathy Lungs; Clear to auscultation bilaterally Breast:  No dominant palpable mass, retraction, or nipple discharge Cardiovascular: Regular rate and rhythm Abdomen:  Soft, non tender, no hepatosplenomegaly Pelvic:  External genitalia is normal in appearance, no lesions.  The vagina is normal in appearance. Urethra has no lesions or masses. The cervix is smooth, pap with HR HPV genotyping performed.  Uterus is felt to be normal size, shape, and contour.  No adnexal masses or tenderness noted.Bladder is non tender, no masses felt. Rectal: Good sphincter tone, no polyps, or hemorrhoids felt.  Hemoccult negative. Extremities/musculoskeletal:  No swelling or varicosities noted, no clubbing or cyanosis Psych:  No mood changes, alert and cooperative,seems happy AA is  2 Fall risk is low    09/07/2022   12:09 PM 02/28/2022    8:06 AM 12/07/2020    8:51 AM  Depression screen PHQ 2/9  Decreased Interest '1 2 1  '$ Down, Depressed, Hopeless 0 2 0  PHQ - 2 Score '1 4 1  '$ Altered sleeping '1 3 1  '$ Tired, decreased energy '2 3 2  '$ Change in appetite 0 1 2  Feeling bad or failure about yourself  0 0 0  Trouble concentrating 0 3 0  Moving slowly or fidgety/restless 1 0 0  Suicidal thoughts 0 0 0  PHQ-9 Score '5 14 6  '$ Difficult doing work/chores  Somewhat difficult        09/07/2022   12:09 PM 12/07/2020    8:51 AM 11/23/2020    8:56 AM 03/15/2020    9:55 AM  GAD 7 : Generalized Anxiety Score  Nervous, Anxious, on Edge '1 1 1 1  '$ Control/stop worrying '1 1 1 1  '$ Worry too much - different things '1 1 1 1  '$ Trouble relaxing 0 0 1 0  Restless 0 0 0 0  Easily annoyed or irritable '1 1 2 3  '$ Afraid - awful might happen 0 0 0 0  Total GAD 7 Score '4 4 6 6  '$ Anxiety Difficulty    Somewhat difficult      Upstream - 09/07/22 1205       Pregnancy Intention Screening   Does the patient want to become pregnant in the next year? No    Does the patient's partner want to become pregnant in the next year?  No    Would the patient like to discuss contraceptive options today? Yes      Contraception Wrap Up   Current Method No Method - Other Reason    Reason for No Current Contraceptive Method at Intake ([facility] Only) Other    End Method Oral Contraceptive    Contraception Counseling Provided Yes    How was the end contraceptive method provided? Provided on site            Examination chaperoned by Levy Pupa LPN   Impression and Plan: 1. Encounter for gynecological examination with Papanicolaou smear of cervix Pap sent Pap in 3 years if normal Physical in 1 year - Cytology - PAP( [facility])  2. Pregnancy examination or test, negative result  - POCT urine pregnancy  3. Encounter for screening fecal occult blood testing Hemoccult was negative  - POCT occult blood  stool  4. Screening mammogram for breast cancer Scheduled for her 09/17/22 at 7:30 at [facility]  - MM 3D SCREEN BREAST BILATERAL; Future  5. Screening for colorectal cancer Will rx cologuard - Cologuard  6. Encounter for initial prescription of contraceptive pills Denies MI,stroke, breast cancer,DVT or migraine with aura Will try lo Loestrin, 3 packs given can start today and use condoms for 1 pack Follow up in 3 months for ROS  7. Irregular periods Will try Lo Loestrin   8. Hot flashes Will try lo Loestrin  9. Perimenopausal

## 2022-09-10 LAB — CYTOLOGY - PAP
Comment: NEGATIVE
Diagnosis: NEGATIVE
High risk HPV: NEGATIVE

## 2022-09-13 ENCOUNTER — Ambulatory Visit: Payer: 59 | Admitting: Family Medicine

## 2022-09-13 VITALS — BP 102/72 | HR 79 | Wt 170.6 lb

## 2022-09-13 DIAGNOSIS — E669 Obesity, unspecified: Secondary | ICD-10-CM

## 2022-09-13 DIAGNOSIS — I10 Essential (primary) hypertension: Secondary | ICD-10-CM | POA: Diagnosis not present

## 2022-09-13 DIAGNOSIS — Z6831 Body mass index (BMI) 31.0-31.9, adult: Secondary | ICD-10-CM

## 2022-09-13 MED ORDER — LOSARTAN POTASSIUM-HCTZ 50-12.5 MG PO TABS: 1.0000 | ORAL_TABLET | Freq: Every day | ORAL | 1 refills | Status: DC

## 2022-09-13 MED ORDER — VALACYCLOVIR HCL 1 G PO TABS: ORAL_TABLET | ORAL | 5 refills | Status: DC

## 2022-09-13 NOTE — Progress Notes (Signed)
   Subjective:    Patient ID: Jillian Ford, female    DOB: 01/27/1971, 52 y.o.   MRN: [id number]  HPI Patient arrives today for medication check and blood pressure.  The patient's BMI is calculated.  The patient does have obesity.  The patient does try to some degree staying active and watching diet.  It is in the vital signs and acknowledged.  It is above the recommended BMI for the patient's height and weight.  The patient has been counseled regarding healthy diet, restricted portions, avoiding excessive carbohydrates/sugary foods, and increase physical activity as health permits.  It is in the patient's best interest to lower the risk of secondary illness including heart disease strokes and cancer by losing weight.  The patient acknowledges this information.  Patient for blood pressure check up.  The patient does have hypertension.   Patient relates dietary measures try to minimize salt The importance of healthy diet and activity were discussed Patient relates compliance     Review of Systems     Objective:   Physical Exam  General-in no acute distress Eyes-no discharge Lungs-respiratory rate normal, CTA CV-no murmurs,RRR Extremities skin warm dry no edema Neuro grossly normal Behavior normal, alert       Assessment & Plan:  1. Primary hypertension Her blood pressure is slightly on the low end.  She denies feeling dizzy.  We did discuss how often we will reduce the dose to plain losartan her previous blood pressures that her other 2 facilities were slightly higher but they also checked her blood pressure immediately upon walking into the exam room rather than waiting 5 minutes.  But given the patient's reservations we will stick with the current dosing and if she starts finding herself feeling lightheaded she will let us know we will reduce the dose down to 50 mg daily.  Otherwise stick with the current dosing follow-up within 6 months with [place] - losartan-hydrochlorothiazide  (HYZAAR) 50-12.5 MG tablet; Take 1 tablet by mouth daily.  Dispense: 90 tablet; Refill: 1  2. Obesity,current BMI 28.8 Healthy diet recommended she sees [facility] we currently have her on medications seems to be helping We did discuss importance of walking healthy eating  Patient did have Cologuard ordered by her gynecologist awaiting the results

## 2022-09-17 ENCOUNTER — Ambulatory Visit (HOSPITAL_COMMUNITY)
Admission: RE | Admit: 2022-09-17 | Discharge: 2022-09-17 | Disposition: A | Discharge status: Home / Self Care | Payer: 59 | Source: Ambulatory Visit | Attending: Adult Health | Admitting: Adult Health

## 2022-09-17 DIAGNOSIS — Z1231 Encounter for screening mammogram for malignant neoplasm of breast: Secondary | ICD-10-CM | POA: Diagnosis not present

## 2022-09-24 ENCOUNTER — Other Ambulatory Visit: Payer: Self-pay | Admitting: Adult Health

## 2022-09-24 ENCOUNTER — Other Ambulatory Visit (HOSPITAL_COMMUNITY): Payer: Self-pay | Admitting: Adult Health

## 2022-09-24 DIAGNOSIS — R928 Other abnormal and inconclusive findings on diagnostic imaging of breast: Secondary | ICD-10-CM

## 2022-09-26 ENCOUNTER — Other Ambulatory Visit: Payer: Self-pay | Admitting: Adult Health

## 2022-09-26 ENCOUNTER — Ambulatory Visit
Admission: RE | Admit: 2022-09-26 | Discharge: 2022-09-26 | Disposition: A | Discharge status: Home / Self Care | Payer: 59 | Source: Ambulatory Visit | Attending: Adult Health | Admitting: Adult Health

## 2022-09-26 ENCOUNTER — Encounter (INDEPENDENT_AMBULATORY_CARE_PROVIDER_SITE_OTHER): Payer: Self-pay | Admitting: Family Medicine

## 2022-09-26 ENCOUNTER — Ambulatory Visit (INDEPENDENT_AMBULATORY_CARE_PROVIDER_SITE_OTHER): Payer: 59 | Admitting: Family Medicine

## 2022-09-26 VITALS — BP 111/77 | HR 81 | Temp 98.4°F | Ht 64.0 in | Wt 165.0 lb

## 2022-09-26 DIAGNOSIS — E669 Obesity, unspecified: Secondary | ICD-10-CM | POA: Diagnosis not present

## 2022-09-26 DIAGNOSIS — R632 Polyphagia: Secondary | ICD-10-CM

## 2022-09-26 DIAGNOSIS — R928 Other abnormal and inconclusive findings on diagnostic imaging of breast: Secondary | ICD-10-CM

## 2022-09-26 DIAGNOSIS — Z6828 Body mass index (BMI) 28.0-28.9, adult: Secondary | ICD-10-CM

## 2022-09-26 DIAGNOSIS — E559 Vitamin D deficiency, unspecified: Secondary | ICD-10-CM | POA: Diagnosis not present

## 2022-09-26 DIAGNOSIS — I10 Essential (primary) hypertension: Secondary | ICD-10-CM

## 2022-09-26 MED ORDER — TOPIRAMATE 50 MG PO TABS: 50.0000 mg | ORAL_TABLET | Freq: Every day | ORAL | 0 refills | Status: DC

## 2022-09-26 MED ORDER — IOPAMIDOL (ISOVUE-370) INJECTION 76%
100.0000 mL | Freq: Once | INTRAVENOUS | Status: AC | PRN
  Administered 2022-09-26: 100 mL via INTRAVENOUS

## 2022-09-26 NOTE — Assessment & Plan Note (Signed)
Improving She is doing a better job of eating on a schedule getting lean protein and fiber with meals.  She has improved compliance taking Topamax 50 mg once a day versus 25 mg twice daily.  Aim for 85 g of protein intake daily.  Encouraged more than 2 servings of nonstarchy vegetables daily.

## 2022-09-26 NOTE — Assessment & Plan Note (Signed)
Last vitamin D Lab Results  Component Value Date   VD25OH 78.8 04/01/2019   She is currently taking over-the-counter vitamin D 4000 IU once daily.  She declined getting her labs checked today.  Will update a chemistry panel and vitamin D level next visit.

## 2022-09-26 NOTE — Assessment & Plan Note (Signed)
Blood pressure is well-controlled today.  She is on losartan/HCTZ 50/12.5 mg once daily.  She notes a lower blood pressure reading when seeing her PCP recently but denies feeling hypotensive.  She has concerns about stopping her diuretic as she previously had edema.  Continue to work on healthy eating, reducing intake of sodium and hydrating well with water.  Follow-up with PCP for management of hypertension.

## 2022-09-26 NOTE — Assessment & Plan Note (Signed)
>>  ASSESSMENT AND PLAN FOR PRIMARY HYPERTENSION WRITTEN ON 09/26/2022  3:20 PM BY BOWEN, KAREN E, DO  Blood pressure is well-controlled today.  She is on losartan/HCTZ 50/12.5 mg once daily.  She notes a lower blood pressure reading when seeing her PCP recently but denies feeling hypotensive.  She has concerns about stopping her diuretic as she previously had edema.  Continue to work on healthy eating, reducing intake of sodium and hydrating well with water.  Follow-up with PCP for management of hypertension.

## 2022-09-26 NOTE — Assessment & Plan Note (Signed)
Reviewed easy quick and dinner options as opposed to eating out at night including clean eats and hello fresh.

## 2022-09-26 NOTE — Progress Notes (Signed)
Office: [phone]  /  Fax: [place]  Starting Date: 02/28/22  Starting Weight: 178lb   Weight Lost Since Last Visit: 3lb   Vitals Temp: 98.4 F (36.9 C) BP: 111/77 Pulse Rate: 81 SpO2: 98 %   Body Composition  Body Fat %: 36.2 % Fat Mass (lbs): 59.8 lbs Muscle Mass (lbs): 100.2 lbs Total Body Water (lbs): 65.4 lbs Visceral Fat Rating : 9   HPI  Chief Complaint: OBESITY  Jillian Ford is here to discuss her progress with her obesity treatment plan. She is on the the Category 3 Plan and states she is following her eating plan approximately 75 % of the time. She states she is exercising 30-45 minutes 3 times per week.  Interval History:  Since last office visit she is down 3 lb Her net weight loss if 13 lb in 7 mos of medically supervised weight management She changed from topiramate 25 mg bid to topiramate 50 mg once a day.   She has been having a hard time sleeping at night Avoids caffeine in the afternoon Getting in meals on her plan Stress levels have high at work and with recent breast imaging Doing better with meal planning and bringing lunch to work  Pharmacotherapy: topiramate  PHYSICAL EXAM:  Blood pressure 111/77, pulse 81, temperature 98.4 F (36.9 C), height '5\' 4"'$  (1.626 m), weight 165 lb (74.8 kg), last menstrual period 08/24/2022, SpO2 98 %. Body mass index is 28.32 kg/m.  General: She is overweight, cooperative, alert, well developed, and in no acute distress. PSYCH: Has normal mood, affect and thought process.   Lungs: Normal breathing effort, no conversational dyspnea.   ASSESSMENT AND PLAN  TREATMENT PLAN FOR OBESITY:  Recommended Dietary Goals  Jillian Ford is currently in the action stage of change. As such, her goal is to continue weight management plan. She has agreed to the Category 3 Plan.  Behavioral Intervention  We discussed the following Behavioral Modification Strategies today: increasing lean  protein intake, increasing vegetables, increasing fiber rich foods, avoiding skipping meals, increasing water intake, work on meal planning and easy cooking plans, and decreasing eating out, consumption of processed foods, and making healthy choices when eating convenient foods.  Additional resources provided today: NA  Recommended Physical Activity Goals  Jillian Ford has been advised to work up to 150 minutes of moderate intensity aerobic activity a week and strengthening exercises 2-3 times per week for cardiovascular health, weight loss maintenance and preservation of muscle mass.   She has agreed to Will continue regular aerobic exercise 30 minutes, 5 times per week. Chosen activity walking.  Pharmacotherapy changes for the treatment of obesity:   ASSOCIATED CONDITIONS ADDRESSED TODAY  Vitamin D deficiency Assessment & Plan: Last vitamin D Lab Results  Component Value Date   VD25OH 78.8 04/01/2019   She is currently taking over-the-counter vitamin D 4000 IU once daily.  She declined getting her labs checked today.  Will update a chemistry panel and vitamin D level next visit.  Orders: -     VITAMIN D 25 Hydroxy (Vit-D Deficiency, Fractures)  Polyphagia Assessment & Plan: Improving She is doing a better job of eating on a schedule getting lean protein and fiber with meals.  She has improved compliance taking Topamax 50 mg once a day versus 25 mg twice daily.  Aim for 85 g of protein intake daily.  Encouraged more than 2 servings of nonstarchy vegetables daily.  Orders: -     Topiramate; Take 1  tablet (50 mg total) by mouth daily.  Dispense: 30 tablet; Refill: 0  Primary hypertension Assessment & Plan: Blood pressure is well-controlled today.  She is on losartan/HCTZ 50/12.5 mg once daily.  She notes a lower blood pressure reading when seeing her PCP recently but denies feeling hypotensive.  She has concerns about stopping her diuretic as she previously had edema.  Continue to  work on healthy eating, reducing intake of sodium and hydrating well with water.  Follow-up with PCP for management of hypertension.  Orders: -     Comprehensive metabolic panel  Generalized obesity Assessment & Plan: Reviewed easy quick and dinner options as opposed to eating out at night including clean eats and hello fresh.   BMI 28.0-28.9,adult      She was informed of the importance of frequent follow up visits to maximize her success with intensive lifestyle modifications for her multiple health conditions.   ATTESTASTION STATEMENTS:  Reviewed by clinician on day of visit: allergies, medications, problem list, medical history, surgical history, family history, social history, and previous encounter notes pertinent to obesity diagnosis.   I have personally spent 30 minutes total time today in preparation, patient care, nutritional counseling and documentation for this visit, including the following: review of clinical lab tests; review of medical tests/procedures/services.      Dell Ponto, DO DABFM, DABOM [facility] [address] [place], [place] [address] [phone]

## 2022-09-27 ENCOUNTER — Encounter: Payer: Self-pay | Admitting: Family Medicine

## 2022-10-02 ENCOUNTER — Ambulatory Visit
Admission: EM | Admit: 2022-10-02 | Discharge: 2022-10-02 | Disposition: A | Discharge status: Home / Self Care | Payer: 59 | Attending: Nurse Practitioner | Admitting: Nurse Practitioner

## 2022-10-02 ENCOUNTER — Other Ambulatory Visit: Payer: Self-pay

## 2022-10-02 ENCOUNTER — Encounter: Payer: Self-pay | Admitting: Emergency Medicine

## 2022-10-02 DIAGNOSIS — R42 Dizziness and giddiness: Secondary | ICD-10-CM | POA: Diagnosis not present

## 2022-10-02 LAB — POCT FASTING CBG KUC MANUAL ENTRY: POCT Glucose (KUC): 94 mg/dL (ref 70–99)

## 2022-10-02 MED ORDER — MECLIZINE HCL 12.5 MG PO TABS: 12.5000 mg | ORAL_TABLET | Freq: Three times a day (TID) | ORAL | 0 refills | Status: AC | PRN

## 2022-10-02 NOTE — Discharge Instructions (Signed)
Take the meclizine as needed for vertigo You can try the Epley Maneuver at home (instructions are at the end of this packet) Seek care with PCP if symptoms persist despite this treatment

## 2022-10-02 NOTE — ED Triage Notes (Signed)
Pt reports dizziness, generalized weakness since waking up at 630 this am. Pt reports "room spinning sensation." History of vertigo.  Reports went to bed approx 930-10pm last night and felt ok. Pt stated woke up at 3am to go to the bathroom and reports mild dizziness then as well.  Pt alert and oriented. Gait steady. Denies any numbness or tingling in extremities. Facial symmetry noted. Speech clear. Equal grips.

## 2022-10-02 NOTE — ED Provider Notes (Signed)
[facility]    CSN: [id number] Arrival date & time: 10/02/22  [id number]      History   Chief Complaint Chief Complaint  Patient presents with   Dizziness    HPI Jillian Ford is a 52 y.o. female.   Patient presents today for 1 day history of room spinning sensation that lasts a few seconds every time she moves her head.  Reports symptoms are worse when she rolls over in bed or with bending over.  No worsening symptoms with exertion, loud noises.  No recent head injury, accident, fall, or trauma to her head.  No recent cough, congestion, fever, or sore throat.  No nausea, vomiting, new ringing in the ears, unsteady gait, postural instability, double vision or blurred vision, dysarthria, dysphagia, weakness, pallor, diaphoresis, chest pain, shortness of breath.  Reports history of similar years ago, does not remember if she was given a medication or not.  Has not take anything for symptoms so far.    Past Medical History:  Diagnosis Date   Anxiety    Back pain    Depression    Edema of both lower extremities    GERD (gastroesophageal reflux disease)    Hypertension    IBS (irritable bowel syndrome)    Palpitations    Sleep apnea    SOB (shortness of breath)     Patient Active Problem List   Diagnosis Date Noted   Screening for colorectal cancer 09/07/2022   Screening mammogram for breast cancer 09/07/2022   Encounter for screening fecal occult blood testing 09/07/2022   Pregnancy examination or test, negative result 09/07/2022   Encounter for gynecological examination with Papanicolaou smear of cervix 09/07/2022   Encounter for initial prescription of contraceptive pills 09/07/2022   Irregular periods 09/07/2022   Hot flashes 09/07/2022   Generalized obesity 08/28/2022   Polyphagia 05/16/2022   Essential hypertension 04/12/2022   Perimenopausal 04/12/2022   Primary hypertension 03/14/2022   Amenorrhea 03/14/2022   Depression 03/14/2022   Elevated ALT  measurement 03/14/2022   Vitamin D deficiency 03/14/2022   Elevated BP without diagnosis of hypertension 12/07/2020   General counseling and advice for contraceptive management 12/07/2020   Bug bite 12/07/2020   Depression with anxiety 03/18/2020   Gastroesophageal reflux disease without esophagitis 12/08/2016   Irritable bowel syndrome with both constipation and diarrhea 12/08/2016   Ganglion cyst of right foot 12/08/2016   Anxiety 08/29/2015   Insomnia 08/29/2015   Gastritis 03/05/2013    History reviewed. No pertinent surgical history.  OB History     Gravida  2   Para  2   Term  2   Preterm      AB      Living  2      SAB      IAB      Ectopic      Multiple      Live Births  2            Home Medications    Prior to Admission medications   Medication Sig Start Date End Date Taking? Authorizing Provider  meclizine (ANTIVERT) 12.5 MG tablet Take 1 tablet (12.5 mg total) by mouth 3 (three) times daily as needed for dizziness. Do not take with alcohol or while driving or operating heavy machinery.  May cause drowsiness. 10/02/22  Yes Eulogio Bear, NP  acetaminophen (TYLENOL) 325 MG tablet Take 650 mg by mouth every 6 (six) hours as needed.    [provider]  calcium carbonate (TUMS - DOSED IN MG ELEMENTAL CALCIUM) 500 MG chewable tablet Chew 1 tablet by mouth daily.    [provider]  Cholecalciferol (VITAMIN D3 PO) Take by mouth.    [provider]  ibuprofen (ADVIL) 200 MG tablet Take 400 mg by mouth as needed.    [provider]  losartan-hydrochlorothiazide (HYZAAR) 50-12.5 MG tablet Take 1 tablet by mouth daily. 09/13/22   Kathyrn Drown, MD  Norethindrone-Ethinyl Estradiol-Fe Biphas (LO LOESTRIN FE) 1 MG-10 MCG / 10 MCG tablet Take 1 tablet by mouth daily. Take 1 daily by mouth 09/07/22   Derrek Monaco A, NP  topiramate (TOPAMAX) 50 MG tablet Take 1 tablet (50 mg total) by mouth daily. 09/26/22   Bowen,  Collene Leyden, DO  valACYclovir (VALTREX) 1000 MG tablet 2 pills at first sign of cold sore and repeat in 12 hours use as needed 09/13/22   Kathyrn Drown, MD    Family History Family History  Problem Relation Age of Onset   Hypertension Mother    Other Mother        brain injury   Alcohol abuse Father    Stroke Father    Early death Father    Hypertension Father    Sleep apnea Father    Dementia Maternal Grandmother    Heart attack Paternal Grandmother     Social History Social History   Tobacco Use   Smoking status: Never   Smokeless tobacco: Never  Vaping Use   Vaping Use: Never used  Substance Use Topics   Alcohol use: Yes    Comment: social   Drug use: Never     Allergies   Wellbutrin [bupropion]   Review of Systems Review of Systems Per HPI  Physical Exam Triage Vital Signs ED Triage Vitals  Enc Vitals Group     BP 10/02/22 1013 117/86     Pulse Rate 10/02/22 1013 82     Resp 10/02/22 1013 20     Temp 10/02/22 1013 98.1 F (36.7 C)     Temp Source 10/02/22 1013 Oral     SpO2 10/02/22 1013 98 %     Weight --      Height --      Head Circumference --      Peak Flow --      Pain Score 10/02/22 1012 0     Pain Loc --      Pain Edu? --      Excl. in [place]? --    Orthostatic VS for the past 24 hrs:  BP- Lying Pulse- Lying BP- Sitting Pulse- Sitting BP- Standing at 0 minutes Pulse- Standing at 0 minutes  10/02/22 1029 125/83 75 122/84 78 135/88 82    Updated Vital Signs BP 117/86 (BP Location: Right Arm)   Pulse 82   Temp 98.1 F (36.7 C) (Oral)   Resp 20   LMP 08/24/2022 (Approximate)   SpO2 98%   Visual Acuity Right Eye Distance:   Left Eye Distance:   Bilateral Distance:    Right Eye Near:   Left Eye Near:    Bilateral Near:     Physical Exam Vitals and nursing note reviewed.  Constitutional:      General: She is not in acute distress.    Appearance: Normal appearance. She is not toxic-appearing.  HENT:     Head: Normocephalic and  atraumatic.     Right Ear: Tympanic membrane, ear canal and external ear  normal. There is no impacted cerumen.     Left Ear: Tympanic membrane, ear canal and external ear normal. There is no impacted cerumen.     Nose: Nose normal. No congestion or rhinorrhea.     Mouth/Throat:     Mouth: Mucous membranes are moist.     Pharynx: Oropharynx is clear. No posterior oropharyngeal erythema.  Eyes:     General: No scleral icterus.    Extraocular Movements: Extraocular movements intact.     Right eye: Normal extraocular motion.     Left eye: Normal extraocular motion.     Pupils: Pupils are equal, round, and reactive to light.  Cardiovascular:     Rate and Rhythm: Normal rate and regular rhythm.  Pulmonary:     Effort: Pulmonary effort is normal. No respiratory distress.     Breath sounds: Normal breath sounds. No wheezing, rhonchi or rales.  Musculoskeletal:     Cervical back: Normal range of motion.  Lymphadenopathy:     Cervical: No cervical adenopathy.  Skin:    General: Skin is warm and dry.     Capillary Refill: Capillary refill takes less than 2 seconds.     Coloration: Skin is not jaundiced or pale.     Findings: No erythema.  Neurological:     General: No focal deficit present.     Mental Status: She is alert and oriented to person, place, and time.     Cranial Nerves: Cranial nerves 2-12 are intact.     Sensory: Sensation is intact.     Motor: Motor function is intact.     Coordination: Coordination is intact. Romberg sign negative. Heel to [facility] Test normal. Rapid alternating movements normal.     Gait: Gait is intact. Gait normal.      UC Treatments / Results  Labs (all labs ordered are listed, but only abnormal results are displayed) Labs Reviewed  POCT FASTING CBG [place]    EKG   Radiology No results found.  Procedures Procedures (including critical care time)  Medications Ordered in UC Medications - No data to display  Initial Impression /  Assessment and Plan / UC Course  I have reviewed the triage vital signs and the nursing notes.  Pertinent labs & imaging results that were available during my care of the patient were reviewed by me and considered in my medical decision making (see chart for details).   Patient is well-appearing, normotensive, afebrile, not tachycardic, not tachypneic, oxygenating well on room air.    1. Dizziness No red flags in history or on exam today Vital signs including orthostatic vital signs are reassuring CBG is not elevated or low EKG today is reassuring-normal sinus rhythm without ST segment or T wave elevation; no obvious changes when compared with previous EKG in August, 2023 Suspect vertigo Treat with meclizine every 8 hours as needed Discussed Epley maneuvers Recommended follow-up with PCP if no improvement or worsening of symptoms despite treatment ER precautions discussed Note given for work  The patient was given the opportunity to ask questions.  All questions answered to their satisfaction.  The patient is in agreement to this plan.    Final Clinical Impressions(s) / UC Diagnoses   Final diagnoses:  Dizziness     Discharge Instructions      Take the meclizine as needed for vertigo You can try the Epley Maneuver at home (instructions are at the end of this packet) Seek care with PCP if symptoms persist despite this treatment  ED Prescriptions     Medication Sig Dispense Auth. Provider   meclizine (ANTIVERT) 12.5 MG tablet Take 1 tablet (12.5 mg total) by mouth 3 (three) times daily as needed for dizziness. Do not take with alcohol or while driving or operating heavy machinery.  May cause drowsiness. 30 tablet Eulogio Bear, NP      PDMP not reviewed this encounter.   Eulogio Bear, NP 10/02/22 1049

## 2022-10-09 ENCOUNTER — Other Ambulatory Visit (HOSPITAL_COMMUNITY): Payer: 59

## 2022-10-09 ENCOUNTER — Encounter (HOSPITAL_COMMUNITY): Payer: 59

## 2022-11-08 ENCOUNTER — Ambulatory Visit (INDEPENDENT_AMBULATORY_CARE_PROVIDER_SITE_OTHER): Payer: 59 | Admitting: Family Medicine

## 2022-11-08 ENCOUNTER — Encounter (INDEPENDENT_AMBULATORY_CARE_PROVIDER_SITE_OTHER): Payer: Self-pay | Admitting: Family Medicine

## 2022-11-08 VITALS — BP 131/83 | HR 91 | Temp 97.9°F | Ht 64.0 in | Wt 165.0 lb

## 2022-11-08 DIAGNOSIS — R632 Polyphagia: Secondary | ICD-10-CM | POA: Diagnosis not present

## 2022-11-08 DIAGNOSIS — J302 Other seasonal allergic rhinitis: Secondary | ICD-10-CM | POA: Diagnosis not present

## 2022-11-08 DIAGNOSIS — E669 Obesity, unspecified: Secondary | ICD-10-CM | POA: Diagnosis not present

## 2022-11-08 DIAGNOSIS — Z6828 Body mass index (BMI) 28.0-28.9, adult: Secondary | ICD-10-CM

## 2022-11-08 DIAGNOSIS — E559 Vitamin D deficiency, unspecified: Secondary | ICD-10-CM

## 2022-11-08 MED ORDER — TOPIRAMATE 50 MG PO TABS: 50.0000 mg | ORAL_TABLET | Freq: Every day | ORAL | 0 refills | Status: DC

## 2022-11-08 NOTE — Progress Notes (Signed)
Office: [phone]  /  Fax: [phone]  WEIGHT SUMMARY AND BIOMETRICS  Starting Date: 02/28/22  Starting Weight: 178lb   Weight Lost Since Last Visit: 0   Vitals Temp: 97.9 F (36.6 C) BP: 131/83 Pulse Rate: 91 SpO2: 97 %   Body Composition  Body Fat %: 36.4 % Fat Mass (lbs): 60.4 lbs Muscle Mass (lbs): 100 lbs Total Body Water (lbs): 68.2 lbs    HPI  Chief Complaint: OBESITY  Jillian Ford is here to discuss her progress with her obesity treatment plan. She is on the the Category 3 Plan and states she is following her eating plan approximately 60 % of the time. She states she is exercising 30 minutes 1-2 times per week.   Interval History:  Since last office visit she is down 0 lb She has maintained her muscle mass She has had head congestion and itchy water eyes x 2 weeks and has not been as hungry She has not as active.  Prior to dealing with allergies, she was walking more outdoors She her energy level is lower and she has not been hydrating was well She has traveled to [employer] She would like to lose 5-10 more pounds She is bringing lunch to work She eats breakfast every morning She brings healthy snacks to work  Her net weight loss is 13 lb in the past 8 mos  Pharmacotherapy: topiramate 50 mg daily  PHYSICAL EXAM:  Blood pressure 131/83, pulse 91, temperature 97.9 F (36.6 C), height  (1.626 m), weight 165 lb (74.8 kg), SpO2 97 %. Body mass index is 28.32 kg/m.  General: She is overweight, cooperative, alert, well developed, and in no acute distress. PSYCH: Has normal mood, affect and thought process.   Lungs: Normal breathing effort, no conversational dyspnea.   ASSESSMENT AND PLAN  TREATMENT PLAN FOR OBESITY:  Recommended Dietary Goals  Jillian Ford is currently in the action stage of change. As such, her goal is to continue weight management plan. She has agreed to the Category 3 Plan.  Behavioral Intervention  We discussed the  following Behavioral Modification Strategies today: increasing lean protein intake, decreasing simple carbohydrates , increasing vegetables, increasing lower glycemic fruits, increasing fiber rich foods, avoiding skipping meals, increasing water intake, work on meal planning and preparation, continue to practice mindfulness when eating, and planning for success.  Additional resources provided today: NA  Recommended Physical Activity Goals  Jillian Ford has been advised to work up to 150 minutes of moderate intensity aerobic activity a week and strengthening exercises 2-3 times per week for cardiovascular health, weight loss maintenance and preservation of muscle mass.   She has agreed to Work on scheduling and tracking physical activity.   Pharmacotherapy changes for the treatment of obesity: no changes  ASSOCIATED CONDITIONS ADDRESSED TODAY  Polyphagia Assessment & Plan: Improved on Topiramate 50 mg once daily without adverse side effect Protein and fiber intake have reduced over the past 2 weeks with head congestion  Continue Topiramate 50 mg daily Aim for 80 g of dietary protein intake daily Allow plenty of non starchy veggies Update CMP today  Orders: -     Topiramate; Take 1 tablet (50 mg total) by mouth daily.  Dispense: 90 tablet; Refill: 0  Generalized obesity  BMI 28.0-28.9,adult  Vitamin D deficiency Assessment & Plan: Last vitamin D Lab Results  Component Value Date   VD25OH 78.8 04/01/2019  She is currently not on a vitamin D supplement Target Vitamin D level 50-70. Low vitamin D  can contribute to poor immune function, leptin resistance, loss of bone mass and fatigue.  Recheck level today.   Seasonal allergies Assessment & Plan: Worsening x 2 weeks with rhinorrhea, post nasal drip, itchy watery eyes and fatigue. Taking occasional Mucinex but nothing else Denies chest congestion, fevers/ chills, malaise  Recommend OTC Flonase 2 sprays per nostril daily and  Zaditor daily for ocular symptoms. Once allergies are under better control, she may resume outdoor walking. If not improving and / or feeling worse please contact PCP       She was informed of the importance of frequent follow up visits to maximize her success with intensive lifestyle modifications for her multiple health conditions.   ATTESTASTION STATEMENTS:  Reviewed by clinician on day of visit: allergies, medications, problem list, medical history, surgical history, family history, social history, and previous encounter notes pertinent to obesity diagnosis.   I have personally spent 30 minutes total time today in preparation, patient care, nutritional counseling and documentation for this visit, including the following: review of clinical lab tests; review of medical tests/procedures/services.      Glennis Brink, DO DABFM, DABOM [facility] [address] [place], [place] [address] [phone]

## 2022-11-08 NOTE — Assessment & Plan Note (Signed)
Last vitamin D Lab Results  Component Value Date   VD25OH 78.8 04/01/2019  She is currently not on a vitamin D supplement Target Vitamin D level 50-70. Low vitamin D can contribute to poor immune function, leptin resistance, loss of bone mass and fatigue.  Recheck level today.

## 2022-11-08 NOTE — Assessment & Plan Note (Signed)
Worsening x 2 weeks with rhinorrhea, post nasal drip, itchy watery eyes and fatigue. Taking occasional Mucinex but nothing else Denies chest congestion, fevers/ chills, malaise  Recommend OTC Flonase 2 sprays per nostril daily and Zaditor daily for ocular symptoms. Once allergies are under better control, she may resume outdoor walking. If not improving and / or feeling worse please contact PCP

## 2022-11-08 NOTE — Assessment & Plan Note (Signed)
Improved on Topiramate 50 mg once daily without adverse side effect Protein and fiber intake have reduced over the past 2 weeks with head congestion  Continue Topiramate 50 mg daily Aim for 80 g of dietary protein intake daily Allow plenty of non starchy veggies Update CMP today

## 2022-11-09 LAB — COMPREHENSIVE METABOLIC PANEL
ALT: 17 IU/L (ref 0–32)
AST: 14 IU/L (ref 0–40)
Albumin/Globulin Ratio: 1.5 (ref 1.2–2.2)
Albumin: 4.3 g/dL (ref 3.8–4.9)
Alkaline Phosphatase: 78 IU/L (ref 44–121)
BUN/Creatinine Ratio: 18 (ref 9–23)
BUN: 16 mg/dL (ref 6–24)
Bilirubin Total: 0.4 mg/dL (ref 0.0–1.2)
CO2: 19 mmol/L — ABNORMAL LOW (ref 20–29)
Calcium: 9 mg/dL (ref 8.7–10.2)
Chloride: 105 mmol/L (ref 96–106)
Creatinine, Ser: 0.91 mg/dL (ref 0.57–1.00)
Globulin, Total: 2.8 g/dL (ref 1.5–4.5)
Glucose: 85 mg/dL (ref 70–99)
Potassium: 3.7 mmol/L (ref 3.5–5.2)
Sodium: 139 mmol/L (ref 134–144)
Total Protein: 7.1 g/dL (ref 6.0–8.5)
eGFR: 76 mL/min/{1.73_m2} (ref >59)

## 2022-11-09 LAB — VITAMIN D 25 HYDROXY (VIT D DEFICIENCY, FRACTURES): Vit D, 25-Hydroxy: 81.9 ng/mL (ref 30.0–100.0)

## 2022-12-06 ENCOUNTER — Encounter: Payer: Self-pay | Admitting: Adult Health

## 2022-12-06 ENCOUNTER — Ambulatory Visit (INDEPENDENT_AMBULATORY_CARE_PROVIDER_SITE_OTHER): Payer: 59 | Admitting: Adult Health

## 2022-12-06 VITALS — BP 109/80 | HR 98 | Ht 64.0 in | Wt 170.0 lb

## 2022-12-06 DIAGNOSIS — N951 Menopausal and female climacteric states: Secondary | ICD-10-CM | POA: Diagnosis not present

## 2022-12-06 DIAGNOSIS — N926 Irregular menstruation, unspecified: Secondary | ICD-10-CM | POA: Diagnosis not present

## 2022-12-06 DIAGNOSIS — R232 Flushing: Secondary | ICD-10-CM

## 2022-12-06 MED ORDER — NEXTSTELLIS 3-14.2 MG PO TABS: 1.0000 | ORAL_TABLET | Freq: Every day | ORAL | 0 refills | Status: DC

## 2022-12-06 NOTE — Progress Notes (Signed)
  Subjective:     Patient ID: Jillian Ford, female   DOB: [birth year], 52 y.o.   MRN: [id number]  HPI Jillian Ford is a 52 year old white female, married, G2P2002, back in follow up on starting lo Loestrin and periods are heavier than they were, has some hot flashes.    Component Value Date/Time   DIAGPAP  09/07/2022 1207    - Negative for intraepithelial lesion or malignancy (NILM)   HPVHIGH Negative 09/07/2022 1207   ADEQPAP  09/07/2022 1207    Satisfactory for evaluation; transformation zone component PRESENT.   PCP is Lilyan Punt MD  Review of Systems Periods heavier that they were +hot flashes Reviewed past medical,surgical, social and family history. Reviewed medications and allergies.     Objective:   Physical Exam BP 109/80 (BP Location: Left Arm, Patient Position: Sitting, Cuff Size: Normal)   Pulse 98   Ht 5\' 4"  (1.626 m)   Wt 170 lb (77.1 kg)   BMI 29.18 kg/m     Skin warm and dry.  Lungs: clear to ausculation bilaterally. Cardiovascular: regular rate and rhythm.  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 No    Does the patient's partner want to become pregnant in the next year? No    Would the patient like to discuss contraceptive options today? No      Contraception Wrap Up   Current Method Oral Contraceptive    End Method Oral Contraceptive    Contraception Counseling Provided No             Assessment:     1. Irregular periods Periods heavier on lo Loestrin, maybe more regular, took last lo Loestrin Friday  Will change to nextstellis,take 1 daily  4 packs given can start today Meds ordered this encounter  Medications   Drospirenone-Estetrol (NEXTSTELLIS) 3-14.2 MG TABS    Sig: Take 1 tablet by mouth daily.    Dispense:  112  tablet    Refill:  0    Order Specific Question:   Supervising Provider    Answer:   Despina Hidden, LUTHER H [[id number]]    2. Perimenopausal Period was heavier on lo loestrin  3. Hot flashes Head feels hot     Plan:     Follow up in 3 months

## 2022-12-11 ENCOUNTER — Ambulatory Visit (INDEPENDENT_AMBULATORY_CARE_PROVIDER_SITE_OTHER): Payer: 59 | Admitting: Family Medicine

## 2022-12-11 ENCOUNTER — Encounter (INDEPENDENT_AMBULATORY_CARE_PROVIDER_SITE_OTHER): Payer: Self-pay | Admitting: Family Medicine

## 2022-12-11 VITALS — BP 124/90 | HR 94 | Temp 97.9°F | Ht 64.0 in | Wt 169.0 lb

## 2022-12-11 DIAGNOSIS — R4589 Other symptoms and signs involving emotional state: Secondary | ICD-10-CM | POA: Diagnosis not present

## 2022-12-11 DIAGNOSIS — Z6829 Body mass index (BMI) 29.0-29.9, adult: Secondary | ICD-10-CM

## 2022-12-11 DIAGNOSIS — E669 Obesity, unspecified: Secondary | ICD-10-CM

## 2022-12-11 DIAGNOSIS — E559 Vitamin D deficiency, unspecified: Secondary | ICD-10-CM | POA: Diagnosis not present

## 2022-12-11 DIAGNOSIS — R632 Polyphagia: Secondary | ICD-10-CM | POA: Diagnosis not present

## 2022-12-11 DIAGNOSIS — I10 Essential (primary) hypertension: Secondary | ICD-10-CM | POA: Diagnosis not present

## 2022-12-11 MED ORDER — ESCITALOPRAM OXALATE 10 MG PO TABS: 10.0000 mg | ORAL_TABLET | Freq: Every day | ORAL | 0 refills | Status: DC

## 2022-12-11 NOTE — Assessment & Plan Note (Signed)
Last vitamin D Lab Results  Component Value Date   VD25OH 81.9 11/08/2022   Reviewed lab from last visit She did stop her OTC vitamin D supplement  Stay off RX and OTC vitamin D Recheck level in 4 mos

## 2022-12-11 NOTE — Assessment & Plan Note (Signed)
DBP elevated at 90 today. Under more stress at work and has forgotten to take Losartan - HCTZ 50/12.5 everyday. Has had more extremity edema  Reminded pt of the importance of taking her BP med everyday Work on reducing intake of high sodium foods Continue to work on dietary changes, regular exercise and weight reduction Avoid stimulants

## 2022-12-11 NOTE — Assessment & Plan Note (Signed)
>>  ASSESSMENT AND PLAN FOR PRIMARY HYPERTENSION WRITTEN ON 12/11/2022  4:43 PM BY BOWEN, KAREN E, DO  DBP elevated at 90 today. Under more stress at work and has forgotten to take Losartan - HCTZ 50/12.5 everyday. Has had more extremity edema  Reminded pt of the importance of taking her BP med everyday Work on reducing intake of high sodium foods Continue to work on dietary changes, regular exercise and weight reduction Avoid stimulants

## 2022-12-11 NOTE — Assessment & Plan Note (Signed)
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  This is impacting her sleep, her relationships at home and her compliance with her meal plan and her motivation to exercise.  She is allergic to wellbutrin and is not taking anything for mood.  She is also going thru perimenopausal symptoms.  Referral to Dr Dewaine Conger for CBT made Work on stress reduction, eating on a schedule and taking time for outdoor walking She agrees to talking to her boss about her new position which is the source of her increased stress Begin Lexapro 10 mg qHS

## 2022-12-11 NOTE — Progress Notes (Signed)
Office: [phone]  /  Fax: [phone]  WEIGHT SUMMARY AND BIOMETRICS  Starting Date: 02/28/22  Starting Weight: 178lb   Weight Lost Since Last Visit: 0   Vitals Temp: 97.9 F (36.6 C) BP: (!) 124/90 Pulse Rate: 94 SpO2: 99 %   Body Composition  Body Fat %: 37.1 % Fat Mass (lbs): 63 lbs Muscle Mass (lbs): 101.2 lbs Total Body Water (lbs): 68.8 lbs Visceral Fat Rating : 8     HPI  Chief Complaint: OBESITY  Jillian Ford is here to discuss her progress with her obesity treatment plan. She is on the the Category 3 Plan and states she is following her eating plan approximately 40 % of the time. She states she is exercising 0 minutes 0 times per week.   Interval History:  Since last office visit she is up 4 lb She has been under more work stress and is doing more stress eating She would like to add in more walking  Her motivation has reduced due to work related stress She has lost her motivation has been low and she is not sleeping well She is going thru perimenopause Not sure if topamax is helping Net weight loss 9 lb in 9 mos  Pharmacotherapy: topamax 50 mg daily  PHYSICAL EXAM:  Blood pressure (!) 124/90, pulse 94, temperature 97.9 F (36.6 C), height 5\' 4"  (1.626 m), weight 169 lb (76.7 kg), SpO2 99 %. Body mass index is 29.01 kg/m.  General: She is overweight, cooperative, alert, well developed, and in no acute distress. PSYCH: Has normal mood, affect and thought process. tearful   Lungs: Normal breathing effort, no conversational dyspnea.   ASSESSMENT AND PLAN  TREATMENT PLAN FOR OBESITY:  Recommended Dietary Goals  [name] is currently in the action stage of change. As such, her goal is to continue weight management plan. She has agreed to the Category 3 Plan.  Behavioral Intervention  We discussed the following Behavioral Modification Strategies today: increasing lean protein intake, decreasing simple carbohydrates , increasing vegetables,  increasing lower glycemic fruits, increasing water intake, keeping healthy foods at home, work on managing stress, creating time for self-care and relaxation measures, avoiding temptations and identifying enticing environmental cues, continue to practice mindfulness when eating, and planning for success.  Additional resources provided today: NA  Recommended Physical Activity Goals  [name] has been advised to work up to 150 minutes of moderate intensity aerobic activity a week and strengthening exercises 2-3 times per week for cardiovascular health, weight loss maintenance and preservation of muscle mass.   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  This is impacting her sleep, her relationships at home and her compliance with her meal plan and her motivation to exercise.  She is allergic to wellbutrin and is not taking anything for mood.  She is also going thru perimenopausal symptoms.  Referral to Dr Dewaine Conger for CBT made Work on stress reduction, eating on a schedule and taking time for outdoor walking She agrees to talking to her boss about her new position which is the source of her increased stress Begin Lexapro 10 mg qHS  Orders: -     Escitalopram Oxalate; Take 1 tablet (  10 mg total) by mouth at bedtime.  Dispense: 30 tablet; Refill: 0  Polyphagia  Generalized obesity with starting BMI 31.5  BMI 29.0-29.9,adult  Vitamin D deficiency Assessment & Plan: Last vitamin D Lab Results  Component Value Date   VD25OH 81.9 11/08/2022   Reviewed lab from last visit She did stop her OTC vitamin D supplement  Stay off RX and OTC vitamin D Recheck level  in 4 mos   Primary hypertension Assessment & Plan: DBP elevated at 90 today. Under more stress at work and has forgotten to take Losartan - HCTZ 50/12.5 everyday. Has had more extremity edema  Reminded pt of the importance of taking her BP med everyday Work on reducing intake of high sodium foods Continue to work on dietary changes, regular exercise and weight reduction Avoid stimulants       She was informed of the importance of frequent follow up visits to maximize her success with intensive lifestyle modifications for her multiple health conditions.   ATTESTASTION STATEMENTS:  Reviewed by clinician on day of visit: allergies, medications, problem list, medical history, surgical history, family history, social history, and previous encounter notes pertinent to obesity diagnosis.   I have personally spent 30 minutes total time today in preparation, patient care, nutritional counseling and documentation for this visit, including the following: review of clinical lab tests; review of medical tests/procedures/services.      Glennis Brink, DO DABFM, DABOM [facility] [address] [place], [place] [address] [phone]

## 2023-01-07 ENCOUNTER — Encounter: Payer: Self-pay | Admitting: Family Medicine

## 2023-01-07 ENCOUNTER — Other Ambulatory Visit: Payer: Self-pay | Admitting: Nurse Practitioner

## 2023-01-07 MED ORDER — TRIAMCINOLONE ACETONIDE 0.1 % EX CREA: 1.0000 | TOPICAL_CREAM | Freq: Two times a day (BID) | CUTANEOUS | 0 refills | Status: DC

## 2023-01-14 ENCOUNTER — Telehealth (INDEPENDENT_AMBULATORY_CARE_PROVIDER_SITE_OTHER): Payer: 59 | Admitting: Psychology

## 2023-01-14 ENCOUNTER — Ambulatory Visit (INDEPENDENT_AMBULATORY_CARE_PROVIDER_SITE_OTHER): Payer: 59 | Admitting: Family Medicine

## 2023-01-15 ENCOUNTER — Other Ambulatory Visit (INDEPENDENT_AMBULATORY_CARE_PROVIDER_SITE_OTHER): Payer: Self-pay | Admitting: Family Medicine

## 2023-01-15 DIAGNOSIS — R4589 Other symptoms and signs involving emotional state: Secondary | ICD-10-CM

## 2023-01-24 ENCOUNTER — Encounter (INDEPENDENT_AMBULATORY_CARE_PROVIDER_SITE_OTHER): Payer: Self-pay | Admitting: Family Medicine

## 2023-01-24 ENCOUNTER — Ambulatory Visit (INDEPENDENT_AMBULATORY_CARE_PROVIDER_SITE_OTHER): Payer: 59 | Admitting: Family Medicine

## 2023-01-24 VITALS — BP 120/72 | HR 74 | Temp 97.7°F | Ht 64.0 in | Wt 171.0 lb

## 2023-01-24 DIAGNOSIS — Z6829 Body mass index (BMI) 29.0-29.9, adult: Secondary | ICD-10-CM

## 2023-01-24 DIAGNOSIS — R632 Polyphagia: Secondary | ICD-10-CM | POA: Diagnosis not present

## 2023-01-24 DIAGNOSIS — R4589 Other symptoms and signs involving emotional state: Secondary | ICD-10-CM | POA: Diagnosis not present

## 2023-01-24 DIAGNOSIS — E669 Obesity, unspecified: Secondary | ICD-10-CM | POA: Diagnosis not present

## 2023-01-24 MED ORDER — ESCITALOPRAM OXALATE 10 MG PO TABS: 10.0000 mg | ORAL_TABLET | Freq: Every day | ORAL | 0 refills | Status: DC

## 2023-01-24 NOTE — Assessment & Plan Note (Signed)
Started Lexapro 10 mg last visit  and this has helped her sleep at night Denies any suicidal ideations

## 2023-01-24 NOTE — Progress Notes (Signed)
Office: [phone]  /  Fax: [phone]  WEIGHT SUMMARY AND BIOMETRICS  No data recorded  No data recorded   No data recorded   No data recorded  No data recorded  HPI  Chief Complaint: OBESITY  Jillian Ford is here to discuss her progress with her obesity treatment plan. She is on the the Category 3 Plan and states she is following her eating plan approximately 65 % of the time. She states she is exercising 0 minutes 0 times per week.   Interval History:  Since last office visit she is up 2 lb She has had social plans and travel She has a sedentary job and has not been walking outdoors due to the heat She has a beach trip coming up She has lost 7 lb in the past 11 mos She has seen Dr Dewaine Conger for CBT She is not skipping meals She is off of topamax and does not feel any different She has struggled planning dinners  Copy of meal plan given  Pharmacotherapy: none  PHYSICAL EXAM:  Blood pressure 120/72, pulse 74, temperature 97.7 F (36.5 C), height 5\' 4"  (1.626 m), weight 171 lb (77.6 kg), SpO2 98%. Body mass index is 29.35 kg/m.  General: She is overweight, cooperative, alert, well developed, and in no acute distress. PSYCH: Has normal mood, affect and thought process.   Lungs: Normal breathing effort, no conversational dyspnea.   ASSESSMENT AND PLAN  TREATMENT PLAN FOR OBESITY:  Recommended Dietary Goals  Jillian Ford is currently in the action stage of change. As such, her goal is to continue weight management plan. She has agreed to the Category 3 Plan.  Behavioral Intervention  We discussed the following Behavioral Modification Strategies today: increasing lean protein intake, decreasing simple carbohydrates , increasing vegetables, increasing lower glycemic fruits, increasing water intake, work on meal planning and preparation, keeping healthy foods at home, work on managing stress, creating time for self-care and relaxation measures, continue to work on  implementation of reduced calorie nutritional plan, continue to practice mindfulness when eating, and planning for success.  Additional resources provided today: NA  Recommended Physical Activity Goals  Jillian Ford has been advised to work up to 150 minutes of moderate intensity aerobic activity a week and strengthening exercises 2-3 times per week for cardiovascular health, weight loss maintenance and preservation of muscle mass.   She has agreed to Start aerobic activity with a goal of 150 minutes a week at moderate intensity.   Pharmacotherapy changes for the treatment of obesity: none  ASSOCIATED CONDITIONS ADDRESSED TODAY  Depressed mood Assessment & Plan: Mood has improved on Lexapro 10 mg at bedtime. Denies adverse SE. Has good support.  Continue current meds Work on keeping junk food triggers out of the house Work on mindful eating  Orders: -     Escitalopram Oxalate; Take 1 tablet (10 mg total) by mouth at bedtime.  Dispense: 90 tablet; Refill: 0  Generalized obesity with starting BMI 31.5  BMI 29.0-29.9,adult  Polyphagia Assessment & Plan: Off Topriamate which was not helping for polyphagia.  Working on eating on a schedule meal planning and getting adequate lean protein and fiber at meals and snacks.    Work on Health visitor protein intake with a goal of 80+ g/ day. Increase water intake to 90 oz/ day       She was informed of the importance of frequent follow up visits to maximize her success with intensive lifestyle modifications for her multiple health conditions.   ATTESTASTION STATEMENTS:  Reviewed by clinician on day of visit: allergies, medications, problem list, medical history, surgical history, family history, social history, and previous encounter notes pertinent to obesity diagnosis.   I have personally spent 30 minutes total time today in preparation, patient care, nutritional counseling and documentation for this visit, including the following:  review of clinical lab tests; review of medical tests/procedures/services.      Glennis Brink, DO DABFM, DABOM [facility] [address] [place], [place] [address] [phone]

## 2023-01-27 NOTE — Assessment & Plan Note (Signed)
Off Topriamate which was not helping for polyphagia.  Working on eating on a schedule meal planning and getting adequate lean protein and fiber at meals and snacks.    Work on Health visitor protein intake with a goal of 80+ g/ day. Increase water intake to 90 oz/ day

## 2023-01-27 NOTE — Assessment & Plan Note (Signed)
Mood has improved on Lexapro 10 mg at bedtime. Denies adverse SE. Has good support.  Continue current meds Work on keeping junk food triggers out of the house Work on mindful eating

## 2023-02-26 ENCOUNTER — Ambulatory Visit (INDEPENDENT_AMBULATORY_CARE_PROVIDER_SITE_OTHER): Payer: 59 | Admitting: Family Medicine

## 2023-03-08 ENCOUNTER — Encounter: Payer: Self-pay | Admitting: Adult Health

## 2023-03-08 ENCOUNTER — Ambulatory Visit: Payer: 59 | Admitting: Adult Health

## 2023-03-08 VITALS — BP 131/89 | HR 90 | Ht 64.0 in | Wt 177.0 lb

## 2023-03-08 DIAGNOSIS — N951 Menopausal and female climacteric states: Secondary | ICD-10-CM | POA: Diagnosis not present

## 2023-03-08 DIAGNOSIS — Z3041 Encounter for surveillance of contraceptive pills: Secondary | ICD-10-CM | POA: Insufficient documentation

## 2023-03-08 DIAGNOSIS — N926 Irregular menstruation, unspecified: Secondary | ICD-10-CM | POA: Diagnosis not present

## 2023-03-08 DIAGNOSIS — R232 Flushing: Secondary | ICD-10-CM | POA: Diagnosis not present

## 2023-03-08 NOTE — Progress Notes (Addendum)
  Subjective:     Patient ID: Jillian Ford, female   DOB: [birth year], 52 y.o.   MRN: [id number]  HPI [name] is a 52 year old white female, married, G2P2002 back in follow up on starting Nextstellis and doing better.      Component Value Date/Time   DIAGPAP  09/07/2022 1207    - Negative for intraepithelial lesion or malignancy (NILM)   HPVHIGH Negative 09/07/2022 1207   ADEQPAP  09/07/2022 1207    Satisfactory for evaluation; transformation zone component PRESENT.    PCP is Dr Gerda Diss   Review of Systems Periods better Hot flashes seen better except at work, but thinks it is just hot Decreased libido  Has had some headaches but relieved with tylenol or advil. Reviewed past medical,surgical, social and family history. Reviewed medications and allergies.     Objective:   Physical Exam BP 131/89 (BP Location: Right Arm)   Pulse 90   Ht 5\' 4"  (1.626 m)   Wt 177 lb (80.3 kg)   LMP 02/28/2023   BMI 30.38 kg/m     Skin warm and dry.  Lungs: clear to ausculation bilaterally. Cardiovascular: regular rate and rhythm.   Upstream - 03/08/23 6578       Pregnancy Intention Screening   Does the patient want to become pregnant in the next year? No    Does the patient's partner want to become pregnant in the next year? No    Would the patient like to discuss contraceptive options today? No      Contraception Wrap Up   Current Method Oral Contraceptive    End Method Oral Contraceptive    Contraception Counseling Provided No    How was the end contraceptive method provided? Provided on site             Assessment:     1. Irregular periods Regular, last about 3 days, first day may be heavy  Happy with Nextstellis   2. Perimenopausal  3. Hot flashes Seem to be better   4. Encounter for surveillance of contraceptive pills Doing better with nextstellis, 6 pack of samples given    Plan:     Return about 09/10/23 for physical

## 2023-03-14 ENCOUNTER — Ambulatory Visit: Payer: 59 | Admitting: Family Medicine

## 2023-03-15 ENCOUNTER — Ambulatory Visit: Payer: 59 | Admitting: Family Medicine

## 2023-04-19 ENCOUNTER — Other Ambulatory Visit: Payer: Self-pay | Admitting: Family Medicine

## 2023-04-19 DIAGNOSIS — Z1212 Encounter for screening for malignant neoplasm of rectum: Secondary | ICD-10-CM

## 2023-04-19 DIAGNOSIS — Z1211 Encounter for screening for malignant neoplasm of colon: Secondary | ICD-10-CM

## 2023-05-07 ENCOUNTER — Encounter: Payer: Self-pay | Admitting: Family Medicine

## 2023-05-07 ENCOUNTER — Ambulatory Visit: Payer: 59 | Admitting: Family Medicine

## 2023-05-07 VITALS — BP 139/82 | HR 92 | Temp 98.6°F | Wt 184.4 lb

## 2023-05-07 DIAGNOSIS — I10 Essential (primary) hypertension: Secondary | ICD-10-CM | POA: Diagnosis not present

## 2023-05-07 DIAGNOSIS — F439 Reaction to severe stress, unspecified: Secondary | ICD-10-CM

## 2023-05-07 DIAGNOSIS — Z1211 Encounter for screening for malignant neoplasm of colon: Secondary | ICD-10-CM

## 2023-05-07 DIAGNOSIS — Z13220 Encounter for screening for lipoid disorders: Secondary | ICD-10-CM

## 2023-05-07 DIAGNOSIS — Z23 Encounter for immunization: Secondary | ICD-10-CM | POA: Diagnosis not present

## 2023-05-07 MED ORDER — LOSARTAN POTASSIUM-HCTZ 50-12.5 MG PO TABS: 1.0000 | ORAL_TABLET | Freq: Every day | ORAL | 1 refills | Status: DC

## 2023-05-07 MED ORDER — ESCITALOPRAM OXALATE 20 MG PO TABS: 10.0000 mg | ORAL_TABLET | Freq: Every day | ORAL | 1 refills | Status: DC

## 2023-05-07 NOTE — Addendum Note (Signed)
Addended by: Elizbeth Squires on: 05/07/2023 01:01 PM   Modules accepted: Orders

## 2023-05-07 NOTE — Progress Notes (Signed)
   Subjective:    Patient ID: REMIAH Ford, female    DOB: 1971-04-19, 52 y.o.   MRN: [id number]  HPI Patient for blood pressure check up.  The patient does have hypertension.   Patient relates dietary measures try to minimize salt The importance of healthy diet and activity were discussed Patient relates compliance   We did discuss the importance of meal planning and try to lose weight we also discussed the importance of regular physical activity Her moods are doing overall okay she does have some depression issues but they seem to be relatively stable but she does not feel the medicine is doing enough for her send so we did discuss increasing the dose of the medication in order to get better response  We also discussed the importance of getting sunlight several times per week as well She relates fairly good compliance Review of Systems     Objective:   Physical Exam  General-in no acute distress Eyes-no discharge Lungs-respiratory rate normal, CTA CV-no murmurs,RRR Extremities skin warm dry no edema Neuro grossly normal Behavior normal, alert   We did discuss colon cancer prevention    Assessment & Plan:   1. Needs flu shot Flu shot today - Flu vaccine trivalent PF, 6mos and older(Flulaval,Afluria,Fluarix,Fluzone)  2. Primary hypertension We did discuss healthier eating, meal planning, fitting and walking during lunch hour continuing blood pressure medicine - losartan-hydrochlorothiazide (HYZAAR) 50-12.5 MG tablet; Take 1 tablet by mouth daily.  Dispense: 90 tablet; Refill: 1  3. Stress We will increase the dose she does relate at times feeling stressed sometimes feels down not suicidal.  She will give [place] feedback within several weeks how she is doing - escitalopram (LEXAPRO) 20 MG tablet; Take 0.5 tablets (10 mg total) by mouth at bedtime.  Dispense: 90 tablet; Refill: 1  4. Screening for colon cancer We will go ahead and order Cologuard  5. Screening,  lipid Screening labs before next visit Follow-up by springtime

## 2023-05-08 ENCOUNTER — Other Ambulatory Visit: Payer: Self-pay | Admitting: Family Medicine

## 2023-05-08 DIAGNOSIS — F439 Reaction to severe stress, unspecified: Secondary | ICD-10-CM

## 2023-05-08 MED ORDER — ESCITALOPRAM OXALATE 20 MG PO TABS: 20.0000 mg | ORAL_TABLET | Freq: Every day | ORAL | 1 refills | Status: DC

## 2023-09-05 ENCOUNTER — Encounter: Payer: Self-pay | Admitting: Emergency Medicine

## 2023-09-05 ENCOUNTER — Ambulatory Visit
Admission: EM | Admit: 2023-09-05 | Discharge: 2023-09-05 | Disposition: A | Discharge status: Home / Self Care | Payer: 59 | Attending: Nurse Practitioner | Admitting: Nurse Practitioner

## 2023-09-05 DIAGNOSIS — J069 Acute upper respiratory infection, unspecified: Secondary | ICD-10-CM | POA: Diagnosis present

## 2023-09-05 DIAGNOSIS — J029 Acute pharyngitis, unspecified: Secondary | ICD-10-CM | POA: Insufficient documentation

## 2023-09-05 LAB — POC COVID19/FLU A&B COMBO
Covid Antigen, POC: NEGATIVE
Influenza A Antigen, POC: NEGATIVE
Influenza B Antigen, POC: NEGATIVE

## 2023-09-05 LAB — POCT RAPID STREP A (OFFICE): Rapid Strep A Screen: NEGATIVE

## 2023-09-05 MED ORDER — FLUTICASONE PROPIONATE 50 MCG/ACT NA SUSP: 2.0000 | Freq: Every day | NASAL | 0 refills | Status: DC

## 2023-09-05 MED ORDER — PROMETHAZINE-DM 6.25-15 MG/5ML PO SYRP: 5.0000 mL | ORAL_SOLUTION | Freq: Four times a day (QID) | ORAL | 0 refills | Status: DC | PRN

## 2023-09-05 NOTE — ED Provider Notes (Signed)
 [facility]    CSN: [id number] Arrival date & time: 09/05/23  1003      History   Chief Complaint No chief complaint on file.   HPI Jillian Ford is a 53 y.o. female.   The history is provided by the patient.   Patient presents for complaints of sore throat, headache, cough, sneezing, and dizziness.  States that when she woke up this morning she was dizzy, but has since improved.  Denies fever, chills, body aches, ear pain, wheezing, difficulty breathing, chest pain, abdominal pain, nausea, vomiting, diarrhea, or rash.  Reports she has been taking over-the-counter cough medication for her symptoms.  Past Medical History:  Diagnosis Date   Anxiety    Back pain    Depression    Edema of both lower extremities    GERD (gastroesophageal reflux disease)    Hypertension    IBS (irritable bowel syndrome)    Palpitations    Sleep apnea    SOB (shortness of breath)     Patient Active Problem List   Diagnosis Date Noted   Encounter for surveillance of contraceptive pills 03/08/2023   Depressed mood 12/11/2022   Seasonal allergies 11/08/2022   Screening for colorectal cancer 09/07/2022   Screening mammogram for breast cancer 09/07/2022   Encounter for screening fecal occult blood testing 09/07/2022   Pregnancy examination or test, negative result 09/07/2022   Encounter for gynecological examination with Papanicolaou smear of cervix 09/07/2022   Encounter for initial prescription of contraceptive pills 09/07/2022   Irregular periods 09/07/2022   Hot flashes 09/07/2022   Generalized obesity with starting BMI 31.5 08/28/2022   Polyphagia 05/16/2022   Essential hypertension 04/12/2022   Perimenopausal 04/12/2022   Primary hypertension 03/14/2022   Amenorrhea 03/14/2022   Depression 03/14/2022   Elevated ALT measurement 03/14/2022   Vitamin D deficiency 03/14/2022   Elevated BP without diagnosis of hypertension 12/07/2020   General counseling and advice for  contraceptive management 12/07/2020   Bug bite 12/07/2020   Depression with anxiety 03/18/2020   Gastroesophageal reflux disease without esophagitis 12/08/2016   Irritable bowel syndrome with both constipation and diarrhea 12/08/2016   Ganglion cyst of right foot 12/08/2016   Anxiety 08/29/2015   Insomnia 08/29/2015   Gastritis 03/05/2013    History reviewed. No pertinent surgical history.  OB History     Gravida  2   Para  2   Term  2   Preterm      AB      Living  2      SAB      IAB      Ectopic      Multiple      Live Births  2            Home Medications    Prior to Admission medications   Medication Sig Start Date End Date Taking? Authorizing Provider  fluticasone (FLONASE) 50 MCG/ACT nasal spray Place 2 sprays into both nostrils daily. 09/05/23  Yes Leath-Warren, Sadie Haber, NP  promethazine-dextromethorphan (PROMETHAZINE-DM) 6.25-15 MG/5ML syrup Take 5 mLs by mouth 4 (four) times daily as needed for cough. 09/05/23  Yes Leath-Warren, Sadie Haber, NP  acetaminophen (TYLENOL) 325 MG tablet Take 650 mg by mouth every 6 (six) hours as needed.    [provider]  calcium carbonate (TUMS - DOSED IN MG ELEMENTAL CALCIUM) 500 MG chewable tablet Chew 1 tablet by mouth daily.    [provider]  Drospirenone-Estetrol (NEXTSTELLIS) 3-14.2 MG TABS  Take 1 tablet by mouth daily. 12/06/22   Adline Potter, NP  escitalopram (LEXAPRO) 20 MG tablet Take 1 tablet (20 mg total) by mouth at bedtime. 05/08/23   Babs Sciara, MD  ibuprofen (ADVIL) 200 MG tablet Take 400 mg by mouth as needed.    [provider]  losartan-hydrochlorothiazide (HYZAAR) 50-12.5 MG tablet Take 1 tablet by mouth daily. 05/07/23   Babs Sciara, MD  meclizine (ANTIVERT) 12.5 MG tablet Take 1 tablet (12.5 mg total) by mouth 3 (three) times daily as needed for dizziness. Do not take with alcohol or while driving or operating heavy machinery.  May cause drowsiness.  10/02/22   Valentino Nose, NP  valACYclovir (VALTREX) 1000 MG tablet 2 pills at first sign of cold sore and repeat in 12 hours use as needed 09/13/22   Babs Sciara, MD    Family History Family History  Problem Relation Age of Onset   Hypertension Mother    Other Mother        brain injury   Alcohol abuse Father    Stroke Father    Early death Father    Hypertension Father    Sleep apnea Father    Dementia Maternal Grandmother    Heart attack Paternal Grandmother     Social History Social History   Tobacco Use   Smoking status: Never   Smokeless tobacco: Never  Vaping Use   Vaping status: Never Used  Substance Use Topics   Alcohol use: Yes    Comment: social   Drug use: Never     Allergies   Wellbutrin [bupropion]   Review of Systems Review of Systems Per HPI  Physical Exam Triage Vital Signs ED Triage Vitals  Encounter Vitals Group     BP 09/05/23 1047 (!) 141/85     Systolic BP Percentile --      Diastolic BP Percentile --      Pulse Rate 09/05/23 1047 96     Resp 09/05/23 1047 18     Temp 09/05/23 1047 98.9 F (37.2 C)     Temp Source 09/05/23 1047 Oral     SpO2 09/05/23 1047 93 %     Weight --      Height --      Head Circumference --      Peak Flow --      Pain Score 09/05/23 1048 8     Pain Loc --      Pain Education --      Exclude from Growth Chart --    No data found.  Updated Vital Signs BP (!) 141/85 (BP Location: Right Arm)   Pulse 96   Temp 98.9 F (37.2 C) (Oral)   Resp 18   LMP 08/22/2023 (Approximate)   SpO2 93%   Visual Acuity Right Eye Distance:   Left Eye Distance:   Bilateral Distance:    Right Eye Near:   Left Eye Near:    Bilateral Near:     Physical Exam Vitals and nursing note reviewed.  Constitutional:      General: She is not in acute distress.    Appearance: Normal appearance.  HENT:     Head: Normocephalic.     Right Ear: Tympanic membrane, ear canal and external ear normal.     Left Ear:  Tympanic membrane, ear canal and external ear normal.     Nose: Congestion present.     Mouth/Throat:     Lips: Pink.  Mouth: Mucous membranes are moist.     Pharynx: Uvula midline. Posterior oropharyngeal erythema and postnasal drip present. No pharyngeal swelling, oropharyngeal exudate or uvula swelling.     Comments: Cobblestoning present to posterior oropharynx  Eyes:     Extraocular Movements: Extraocular movements intact.     Conjunctiva/sclera: Conjunctivae normal.     Pupils: Pupils are equal, round, and reactive to light.  Cardiovascular:     Rate and Rhythm: Normal rate and regular rhythm.     Pulses: Normal pulses.     Heart sounds: Normal heart sounds.  Pulmonary:     Effort: Pulmonary effort is normal.  Abdominal:     General: Bowel sounds are normal.     Palpations: Abdomen is soft.     Tenderness: There is no abdominal tenderness.  Musculoskeletal:     Cervical back: Normal range of motion.  Skin:    General: Skin is warm and dry.  Neurological:     General: No focal deficit present.     Mental Status: She is alert and oriented to person, place, and time.  Psychiatric:        Mood and Affect: Mood normal.        Behavior: Behavior normal.      UC Treatments / Results  Labs (all labs ordered are listed, but only abnormal results are displayed) Labs Reviewed  POCT RAPID STREP A (OFFICE) - Normal  POC COVID19/FLU A&B COMBO - Normal  CULTURE, GROUP A STREP [facility])  POCT INFLUENZA A/B    EKG   Radiology No results found.  Procedures Procedures (including critical care time)  Medications Ordered in UC Medications - No data to display  Initial Impression / Assessment and Plan / UC Course  I have reviewed the triage vital signs and the nursing notes.  Pertinent labs & imaging results that were available during my care of the patient were reviewed by me and considered in my medical decision making (see chart for details).  COVID/flu test and rapid  strep test were negative.  Throat culture has been ordered.  Symptoms consistent with a viral URI with cough.  Will provide symptomatic treatment with Promethazine DM for the cough, and fluticasone 50 mcg nasal spray for nasal congestion and postnasal drainage.  Supportive care recommendations were provided and discussed with the patient to include fluids, rest, over-the-counter analgesics, warm salt water gargles, and use of a humidifier.  Discussed indications regarding follow-up.  Patient was in agreement with this plan of care and verbalized understanding.  All questions were answered.  Patient stable for discharge.  Work note was provided. Final Clinical Impressions(s) / UC Diagnoses   Final diagnoses:  Viral URI with cough  Sore throat     Discharge Instructions      The COVID/flu and rapid strep test were negative.  A throat culture has been ordered.  You will be contacted if the pending test result is positive.  You also have access to the results via MyChart. Take medication as prescribed. Increase fluids and allow for plenty of rest. Recommend over-the-counter Tylenol or ibuprofen as needed for pain, fever, or general discomfort. Warm salt water gargles 3-4 times daily as needed for throat pain or discomfort.  You may also use over-the-counter Chloraseptic throat spray and throat lozenges for symptoms. Recommend use of a humidifier in your bedroom at nighttime during sleep and sleeping elevated on pillows while cough symptoms persist. Symptoms should improve over the next 5 to 7 days.  If  symptoms fail to improve, or appear to be worsening, you may follow-up in this clinic or with your primary care physician for further evaluation. Follow-up as needed.     ED Prescriptions     Medication Sig Dispense Auth. Provider   promethazine-dextromethorphan (PROMETHAZINE-DM) 6.25-15 MG/5ML syrup Take 5 mLs by mouth 4 (four) times daily as needed for cough. 118 mL Leath-Warren, Sadie Haber,  NP   fluticasone (FLONASE) 50 MCG/ACT nasal spray Place 2 sprays into both nostrils daily. 16 g Leath-Warren, Sadie Haber, NP      PDMP not reviewed this encounter.   Abran Cantor, NP 09/05/23 1109

## 2023-09-05 NOTE — Discharge Instructions (Addendum)
 The COVID/flu and rapid strep test were negative.  A throat culture has been ordered.  You will be contacted if the pending test result is positive.  You also have access to the results via MyChart. Take medication as prescribed. Increase fluids and allow for plenty of rest. Recommend over-the-counter Tylenol or ibuprofen as needed for pain, fever, or general discomfort. Warm salt water gargles 3-4 times daily as needed for throat pain or discomfort.  You may also use over-the-counter Chloraseptic throat spray and throat lozenges for symptoms. Recommend use of a humidifier in your bedroom at nighttime during sleep and sleeping elevated on pillows while cough symptoms persist. Symptoms should improve over the next 5 to 7 days.  If symptoms fail to improve, or appear to be worsening, you may follow-up in this clinic or with your primary care physician for further evaluation. Follow-up as needed.

## 2023-09-05 NOTE — ED Triage Notes (Signed)
 Sore throat since Monday. headache, dizziness, cough,sneezing since yesterday.

## 2023-09-08 LAB — CULTURE, GROUP A STREP (THRC)

## 2023-09-09 ENCOUNTER — Encounter: Payer: Self-pay | Admitting: Adult Health

## 2023-09-09 ENCOUNTER — Ambulatory Visit (INDEPENDENT_AMBULATORY_CARE_PROVIDER_SITE_OTHER): Payer: 59 | Admitting: Adult Health

## 2023-09-09 VITALS — BP 128/87 | HR 94 | Ht 64.0 in | Wt 192.5 lb

## 2023-09-09 DIAGNOSIS — R6882 Decreased libido: Secondary | ICD-10-CM

## 2023-09-09 DIAGNOSIS — N393 Stress incontinence (female) (male): Secondary | ICD-10-CM

## 2023-09-09 DIAGNOSIS — Z1212 Encounter for screening for malignant neoplasm of rectum: Secondary | ICD-10-CM

## 2023-09-09 DIAGNOSIS — Z1231 Encounter for screening mammogram for malignant neoplasm of breast: Secondary | ICD-10-CM

## 2023-09-09 DIAGNOSIS — Z3041 Encounter for surveillance of contraceptive pills: Secondary | ICD-10-CM | POA: Diagnosis not present

## 2023-09-09 DIAGNOSIS — Z1211 Encounter for screening for malignant neoplasm of colon: Secondary | ICD-10-CM

## 2023-09-09 DIAGNOSIS — Z01419 Encounter for gynecological examination (general) (routine) without abnormal findings: Secondary | ICD-10-CM | POA: Diagnosis not present

## 2023-09-09 DIAGNOSIS — Z1331 Encounter for screening for depression: Secondary | ICD-10-CM | POA: Diagnosis not present

## 2023-09-09 MED ORDER — NEXTSTELLIS 3-14.2 MG PO TABS: 1.0000 | ORAL_TABLET | Freq: Every day | ORAL | Status: DC

## 2023-09-09 NOTE — Progress Notes (Signed)
 Patient ID: Jillian Ford, female   DOB: 02/13/1971, 53 y.o.   MRN: [id number] History of Present Illness: Jillian Ford is a 53 year old white female, married, G2P2002 in for a well woman gyn exam. She is having urinary incontinence  when she coughs or sneezes and has decreased libido.  She has had light periods on nextstellis, but last one was heavier.  PCP is Dr Lilyan Punt.  Current Medications, Allergies, Past Medical History, Past Surgical History, Family History and Social History were reviewed in [employer] record.     Review of Systems: Patient denies any headaches, hearing loss, fatigue, blurred vision, shortness of breath, chest pain, abdominal pain, problems with bowel movements,  or intercourse. No joint pain or mood swings.  See HPI for positives.   Physical Exam:BP 128/87 (BP Location: Left Arm, Patient Position: Sitting, Cuff Size: Normal)   Pulse 94   Ht 5\' 4"  (1.626 m)   Wt 192 lb 8 oz (87.3 kg)   LMP 08/22/2023 (Approximate)   BMI 33.04 kg/m   General:  Well developed, well nourished, no acute distress Skin:  Warm and dry Neck:  Midline trachea, normal thyroid, good ROM, no lymphadenopathy Lungs; Clear to auscultation bilaterally Breast:  No dominant palpable mass, retraction, or nipple discharge Cardiovascular: Regular rate and rhythm Abdomen:  Soft, non tender, no hepatosplenomegaly Pelvic:  External genitalia is normal in appearance, no lesions.  The vagina is normal in appearance. Urethra has no lesions or masses. The cervix is smooth..  Uterus is felt to be normal size, shape, and contour.  No adnexal masses or tenderness noted.Bladder is non tender, no masses felt. Rectal: Declined Extremities/musculoskeletal:  No swelling or varicosities noted, no clubbing or cyanosis Psych:  No mood changes, alert and cooperative,seems happy AA is 1 Fall risk is low    09/09/2023    8:40 AM 05/07/2023    8:27 AM 12/06/2022    8:42 AM  Depression screen  PHQ 2/9  Decreased Interest 2 3 1   Down, Depressed, Hopeless 1 1 0  PHQ - 2 Score 3 4 1   Altered sleeping 3 3   Tired, decreased energy 3 3   Change in appetite 1 3   Feeling bad or failure about yourself  1 1   Trouble concentrating 1 3   Moving slowly or fidgety/restless 0 1   Suicidal thoughts 0 0   PHQ-9 Score 12 18   Difficult doing work/chores  Extremely dIfficult        09/09/2023    8:40 AM 05/07/2023    8:27 AM 09/13/2022    8:08 AM 09/07/2022   12:09 PM  GAD 7 : Generalized Anxiety Score  Nervous, Anxious, on Edge 2 3 1 1   Control/stop worrying 2 3 1 1   Worry too much - different things 2 3 1 1   Trouble relaxing 1 2 1  0  Restless 0 1 1 0  Easily annoyed or irritable 1 3 1 1   Afraid - awful might happen 1 1 1  0  Total GAD 7 Score 9 16 7 4   Anxiety Difficulty  Very difficult Somewhat difficult     Upstream - 09/09/23 0831       Pregnancy Intention Screening   Does the patient want to become pregnant in the next year? No    Does the patient's partner want to become pregnant in the next year? No    Would the patient like to discuss contraceptive options today? No  Contraception Wrap Up   Current Method Oral Contraceptive    End Method Oral Contraceptive    Contraception Counseling Provided Yes              Examination chaperoned by Malachy Mood LPN   Impression and plan: 1. Encounter for well woman exam with routine gynecological exam (Primary) Physical in 1 year Labs with PCP Pap pin [phone]   2. Encounter for surveillance of contraceptive pills Will continue nextstellis,gave 6 sample packs  Meds ordered this encounter  Medications   Drospirenone-Estetrol (NEXTSTELLIS) 3-14.2 MG TABS    Sig: Take 1 tablet by mouth daily.    Supervising Provider:   Duane Lope H [[id number]]   Follow up in 6 months for ROS  3. Decreased libido Try to increase frequency to 2-3 x weekly   4. SUI (stress urinary incontinence, female) Try kegel exercises Will refer  to urology Did discuss medication may help - Ambulatory referral to Urology  5. Screening for colorectal cancer Order sent for cologuard  - Cologuard  6. Screening mammogram for breast cancer Tp to call for appt in Mid March  - MM 3D SCREENING MAMMOGRAM BILATERAL BREAST; Future

## 2023-10-03 NOTE — Progress Notes (Signed)
 Name: Jillian Ford DOB: August 10, 1970 MRN: [id number]  History of Present Illness: Ms. Jillian Ford is a 53 y.o. female who presents today as a new patient at [facility]. All available relevant medical records have been reviewed.  Today: She reports chief complaint of urinary incontinence.  She reports urge incontinence. She reports stress incontinence with cough/laugh/sneeze. She reports the SUI is predominant.  She leaks small amounts of urine multiple times per day. Wears pads occasionally or sometimes has to change her underwear due to leakage.   She reports urinary incontinence is significantly bothersome.   She reports mild urgency and occasional nocturia. Denies bothersome daytime urinary frequency. She essentially does timed voiding to address these symptoms.  She reports caffeine intake (coffee, soda).  She denies dysuria, gross hematuria, straining to void, or sensations of incomplete emptying.  She denies history of recent or recurrent UTI.  She has 2 child(ren) delivered vaginally. She is peri-menopausal.  She denies vaginal bulge sensation.  She denies seeing a vaginal bulge.   Medications: Current Outpatient Medications  Medication Sig Dispense Refill   acetaminophen (TYLENOL) 325 MG tablet Take 650 mg by mouth every 6 (six) hours as needed.     calcium carbonate (TUMS - DOSED IN MG ELEMENTAL CALCIUM) 500 MG chewable tablet Chew 1 tablet by mouth daily.     Drospirenone-Estetrol (NEXTSTELLIS) 3-14.2 MG TABS Take 1 tablet by mouth daily.     escitalopram (LEXAPRO) 20 MG tablet Take 1 tablet (20 mg total) by mouth at bedtime. 90 tablet 1   fluticasone (FLONASE) 50 MCG/ACT nasal spray Place 2 sprays into both nostrils daily. 16 g 0   ibuprofen (ADVIL) 200 MG tablet Take 400 mg by mouth as needed.     losartan-hydrochlorothiazide (HYZAAR) 50-12.5 MG tablet Take 1 tablet by mouth daily. 90 tablet 1   meclizine (ANTIVERT) 12.5 MG tablet Take 1 tablet (12.5  mg total) by mouth 3 (three) times daily as needed for dizziness. Do not take with alcohol or while driving or operating heavy machinery.  May cause drowsiness. 30 tablet 0   promethazine-dextromethorphan (PROMETHAZINE-DM) 6.25-15 MG/5ML syrup Take 5 mLs by mouth 4 (four) times daily as needed for cough. 118 mL 0   valACYclovir (VALTREX) 1000 MG tablet 2 pills at first sign of cold sore and repeat in 12 hours use as needed 12 tablet 5   No current facility-administered medications for this visit.    Allergies: Allergies  Allergen Reactions   Wellbutrin [Bupropion] Itching and Rash    Past Medical History:  Diagnosis Date   Anxiety    Back pain    Depression    Edema of both lower extremities    GERD (gastroesophageal reflux disease)    Hypertension    IBS (irritable bowel syndrome)    Palpitations    Sleep apnea    SOB (shortness of breath)    History reviewed. No pertinent surgical history. Family History  Problem Relation Age of Onset   Hypertension Mother    Other Mother        brain injury   Alcohol abuse Father    Stroke Father    Early death Father    Hypertension Father    Sleep apnea Father    Dementia Maternal Grandmother    Heart attack Paternal Grandmother    Social History   Socioeconomic History   Marital status: Married    Spouse name: Not on file   Number of children: Not on file  Years of education: Not on file   Highest education level: Some college, no degree  Occupational History   Not on file  Tobacco Use   Smoking status: Never   Smokeless tobacco: Never  Vaping Use   Vaping status: Never Used  Substance and Sexual Activity   Alcohol use: Yes    Comment: social   Drug use: Never   Sexual activity: Yes    Birth control/protection: Pill  Other Topics Concern   Not on file  Social History Narrative   Not on file   Social Drivers of Health   Financial Resource Strain: Low Risk  (09/09/2023)   Overall Financial Resource Strain  (CARDIA)    Difficulty of Paying Living Expenses: Not hard at all  Food Insecurity: No Food Insecurity (09/09/2023)   Hunger Vital Sign    Worried About Running Out of Food in the Last Year: Never true    Ran Out of Food in the Last Year: Never true  Transportation Needs: No Transportation Needs (09/09/2023)   PRAPARE - Administrator, Civil Service (Medical): No    Lack of Transportation (Non-Medical): No  Physical Activity: Inactive (09/09/2023)   Exercise Vital Sign    Days of Exercise per Week: 0 days    Minutes of Exercise per Session: 0 min  Stress: Stress Concern Present (09/09/2023)   [name] of Occupational Health - Occupational Stress Questionnaire    Feeling of Stress : To some extent  Social Connections: Socially Integrated (09/09/2023)   Social Connection and Isolation Panel [NHANES]    Frequency of Communication with Friends and Family: Twice a week    Frequency of Social Gatherings with Friends and Family: Once a week    Attends Religious Services: 1 to 4 times per year    Active Member of [employer] or Organizations: Patient declined    Attends Banker Meetings: 1 to 4 times per year    Marital Status: Married  Catering manager Violence: Not At Risk (09/09/2023)   Humiliation, Afraid, Rape, and Kick questionnaire    Fear of Current or Ex-Partner: No    Emotionally Abused: No    Physically Abused: No    Sexually Abused: No    SUBJECTIVE  Review of Systems Constitutional: Patient denies any unintentional weight loss or change in strength lntegumentary: Patient denies any rashes or pruritus Cardiovascular: Patient denies chest pain or syncope Respiratory: Patient denies shortness of breath Gastrointestinal: Patient denies constipation or diarrhea Musculoskeletal: Patient denies muscle cramps or weakness Neurologic: Patient denies convulsions or seizures Allergic/Immunologic: Patient denies recent allergic  reaction(s) Hematologic/Lymphatic: Patient denies bleeding tendencies Endocrine: Patient denies heat/cold intolerance  GU: As per HPI.  OBJECTIVE Vitals:   10/08/23 0833  BP: 120/78  Pulse: 92  Temp: 99.6 F (37.6 C)   There is no height or weight on file to calculate BMI.  Physical Examination Constitutional: No obvious distress; patient is non-toxic appearing  Cardiovascular: No visible lower extremity edema.  Respiratory: The patient does not have audible wheezing/stridor; respirations do not appear labored  Gastrointestinal: Abdomen non-distended Musculoskeletal: Normal ROM of UEs  Skin: No obvious rashes/open sores  Neurologic: CN 2-12 grossly intact Psychiatric: Answered questions appropriately with normal affect  Hematologic/Lymphatic/Immunologic: No obvious bruises or sites of spontaneous bleeding  Urine microscopy: negative  PVR: 0 ml  ASSESSMENT SUI (stress urinary incontinence, female) - Plan: BLADDER SCAN AMB NON-IMAGING, Urinalysis, Routine w reflex microscopic, Ambulatory referral to Physical Therapy  OAB (overactive bladder)  Urge  incontinence of urine  Urinary urgency  Mixed stress and urge urinary incontinence  We discussed the different forms of urinary incontinence, such as stress and urge incontinence, and described how symptoms are consistent with mixed urinary incontinence.  1. For treatment of stress urinary incontinence: The etiology of this condition was explained in detail to include pelvic floor muscle relaxation and detachment of the urethra away from its connection to the pubic bone. Her risk profile was reviewed, including childbirth.  A visual demonstration of the pathophysiology of stress urinary incontinence was reviewed.  The management options were reviewed to include: No intervention, observation. Non-surgical options: Pelvic floor muscle rehabilitation Incontinence pessary Surgical consultation   She elected to proceed with  pelvic floor physical therapy.  2. For treatment of OAB with urgency, occasional urge incontinence, and occasional nocturia: We discussed the symptoms of overactive bladder (OAB), which include urinary urgency, frequency, nocturia, with or without urge incontinence. Likely exacerbated by caffeine intake.   We discussed the following management options in detail including potential benefits, risks, and side effects: Behavioral therapy: Decreasing bladder irritants (such as caffeine) Urge suppression strategies Bladder retraining / timed voiding Medication(s)  OAB symptoms not significantly bothersome at this time per patient therefore she elected to proceed with expectant management.  We agreed to plan for follow up in 3  months or sooner if needed. Patient verbalized understanding of and agreement with current plan. All questions were answered.  PLAN Advised the following: 1. Referred to pelvic floor physical therapy.  2. Return in about 3 months (around 01/08/2024) for UA, PVR, & f/u with Evette Georges NP.  Orders Placed This Encounter  Procedures   Urinalysis, Routine w reflex microscopic   Ambulatory referral to Physical Therapy    Referral Priority:   Routine    Referral Type:   Physical Medicine    Referral Reason:   Specialty Services Required    Requested Specialty:   Physical Therapy    Number of Visits Requested:   1   BLADDER SCAN AMB NON-IMAGING    It has been explained that the patient is to follow regularly with their PCP in addition to all other providers involved in their care and to follow instructions provided by these respective offices. Patient advised to contact urology clinic if any urologic-pertaining questions, concerns, new symptoms or problems arise in the interim period.  Patient Instructions      Electronically signed by:  Donnita Falls, MSN, FNP-C, CUNP 10/08/2023 9:23 AM

## 2023-10-08 ENCOUNTER — Encounter: Payer: Self-pay | Admitting: Urology

## 2023-10-08 ENCOUNTER — Ambulatory Visit (INDEPENDENT_AMBULATORY_CARE_PROVIDER_SITE_OTHER): Payer: 59 | Admitting: Urology

## 2023-10-08 VITALS — BP 120/78 | HR 92 | Temp 99.6°F

## 2023-10-08 DIAGNOSIS — R3915 Urgency of urination: Secondary | ICD-10-CM | POA: Insufficient documentation

## 2023-10-08 DIAGNOSIS — N3941 Urge incontinence: Secondary | ICD-10-CM | POA: Insufficient documentation

## 2023-10-08 DIAGNOSIS — N3281 Overactive bladder: Secondary | ICD-10-CM | POA: Diagnosis not present

## 2023-10-08 DIAGNOSIS — N393 Stress incontinence (female) (male): Secondary | ICD-10-CM

## 2023-10-08 DIAGNOSIS — N3946 Mixed incontinence: Secondary | ICD-10-CM | POA: Diagnosis not present

## 2023-10-08 LAB — URINALYSIS, ROUTINE W REFLEX MICROSCOPIC
Bilirubin, UA: NEGATIVE
Glucose, UA: NEGATIVE
Ketones, UA: NEGATIVE
Leukocytes,UA: NEGATIVE
Nitrite, UA: NEGATIVE
Protein,UA: NEGATIVE
Specific Gravity, UA: 1.03 (ref 1.005–1.030)
Urobilinogen, Ur: 0.2 mg/dL (ref 0.2–1.0)
pH, UA: 6 (ref 5.0–7.5)

## 2023-10-08 LAB — MICROSCOPIC EXAMINATION: Bacteria, UA: NONE SEEN

## 2023-10-08 NOTE — Patient Instructions (Signed)
 Jillian Ford

## 2023-10-11 ENCOUNTER — Other Ambulatory Visit: Payer: Self-pay | Admitting: Family Medicine

## 2023-10-18 NOTE — Therapy (Signed)
 OUTPATIENT PHYSICAL THERAPY FEMALE PELVIC EVALUATION   Patient Name: Jillian Ford MRN: [id number] DOB:October 19, 1970, 53 y.o., female Today's Date: 10/24/2023  END OF SESSION:  PT End of Session - 10/24/23 0933     Visit Number 1    Number of Visits 6    Date for PT Re-Evaluation 12/11/23   Therapist will be out of town x 2 weeks so break in treatment   Authorization Type [employer]    Progress Note Due on Visit 6    PT Start Time 0815    PT Stop Time 0900    PT Time Calculation (min) 45 min    Activity Tolerance Patient tolerated treatment well    Behavior During Therapy WFL for tasks assessed/performed             Past Medical History:  Diagnosis Date   Anxiety    Back pain    Depression    Edema of both lower extremities    GERD (gastroesophageal reflux disease)    Hypertension    IBS (irritable bowel syndrome)    Palpitations    Sleep apnea    SOB (shortness of breath)    No past surgical history on file. Patient Active Problem List   Diagnosis Date Noted   OAB (overactive bladder) 10/08/2023   Urge incontinence of urine 10/08/2023   Urinary urgency 10/08/2023   Mixed stress and urge urinary incontinence 10/08/2023   SUI (stress urinary incontinence, female) 09/09/2023   Decreased libido 09/09/2023   Seasonal allergies 11/08/2022   Irregular periods 09/07/2022   Hot flashes 09/07/2022   Generalized obesity with starting BMI 31.5 08/28/2022   Polyphagia 05/16/2022   Perimenopausal 04/12/2022   Amenorrhea 03/14/2022   Depression 03/14/2022   Elevated ALT measurement 03/14/2022   Vitamin D deficiency 03/14/2022   Essential hypertension 03/14/2022   Depression with anxiety 03/18/2020   Gastroesophageal reflux disease without esophagitis 12/08/2016   Irritable bowel syndrome with both constipation and diarrhea 12/08/2016   Ganglion cyst of right foot 12/08/2016   Anxiety 08/29/2015   Insomnia 08/29/2015    PCP: Fletcher Anon   REFERRING PROVIDER: Donnita Falls, FNP  REFERRING DIAG:  Diagnosis  N39.3 (ICD-10-CM) - SUI (stress urinary incontinence, female)    THERAPY DIAG:  Diagnosis  N39.3 (ICD-10-CM) - SUI (stress urinary incontinence, female)    Rationale for Evaluation and Treatment: Rehabilitation  ONSET DATE: Chronic   SUBJECTIVE:  SUBJECTIVE STATEMENT: Pt states that she has been having progressive issues with incontinence for years.   Pt states that she has an accident 4-5/week.  She has started wearing mini pads more often as the leakage is increasing.    Fluid intake: PT does not drink much water, probably does not have one glass a day.    PAIN:  Are you having pain? No  PRECAUTIONS: None  RED FLAGS: None   WEIGHT BEARING RESTRICTIONS: No  FALLS:  Has patient fallen in last 6 months? No  PATIENT GOALS: To be dry   PERTINENT HISTORY:  N/a  BOWEL MOVEMENT: Pt has the urge to go and then if she does not go immediately she may have an accident.  Pain with bowel movement: No Type of bowel movement:Frequency daily  Fully empty rectum: No Leakage: very rarely Pads: No not for bowel but for urine Fiber supplement/laxative No  URINATION: Pain with urination: No Fully empty bladder: Yes:   Stream:  in between strong and weak.  Urgency: No Frequency: every 3 hours  Leakage: Coughing, Sneezing, Exercise, and Lifting Pads: Yes: one a day   PREGNANCY: Vaginal deliveries 2 Tearing Yes:   Episiotomy Yes   Currently pregnant No  PROLAPSE: None   OBJECTIVE:  Note: Objective measures were completed at Evaluation unless otherwise noted.    Abdominal: 2/5                  TODAY'S TREATMENT:                                                                                                                              DATE:   10/24/23 Eval Supine lower ab set x 5 Sitting Kegals:  long hold 5" hold rest x 10" x 5                          Quick 2" rest 4" x 10    PATIENT EDUCATION:  Education details: HEP, how both pelvic and lower abdominal mm play a role, importance of increasing water intake  Person educated: Patient Education method: Explanation, Verbal cues, and Handouts Education comprehension: returned demonstration  HOME EXERCISE PROGRAM: Kegals, abdominal set  ASSESSMENT:  CLINICAL IMPRESSION: Patient is a 53 y.o. female who was seen today for physical therapy evaluation and treatment for stress incontinence.   Ms. Bielicki will benefit from skilled PT to strengthen her pelvic floor and core to decrease the incidence of incontinence.    OBJECTIVE IMPAIRMENTS: decreased strength, incontinence  ACTIVITY LIMITATIONS: lifting and hygiene/grooming   REHAB POTENTIAL: Good  CLINICAL DECISION MAKING: Evolving/moderate complexity  EVALUATION COMPLEXITY: Moderate   GOALS: Goals reviewed with patient? No  SHORT TERM GOALS: Target date: 11/14/2023  PT to be I in HEP to reduce accidents to 3x /week  Baseline: Goal status: INITIAL   LONG TERM GOALS: Target date: 12/05/23  PT to be I in advanced  HEP to reduce accidents to 1x /week  Baseline:  Goal status: INITIAL   PLAN:  PT FREQUENCY: 1x/week  PT DURATION: 6 weeks over an 8 week period   PLANNED INTERVENTIONS: 97110-Therapeutic exercises and 46962- Self Care  PLAN FOR NEXT SESSION: increase long hold kegals, begin all four and standing lower abdominal, heel slides and hip abduction.   Virgina Organ, PT CLT [phone]  10/24/2023, 9:34 AM

## 2023-10-24 ENCOUNTER — Ambulatory Visit (HOSPITAL_COMMUNITY): Attending: Urology | Admitting: Physical Therapy

## 2023-10-24 ENCOUNTER — Other Ambulatory Visit: Payer: Self-pay

## 2023-10-24 DIAGNOSIS — N393 Stress incontinence (female) (male): Secondary | ICD-10-CM | POA: Diagnosis not present

## 2023-10-24 DIAGNOSIS — N3946 Mixed incontinence: Secondary | ICD-10-CM | POA: Insufficient documentation

## 2023-10-30 ENCOUNTER — Encounter (HOSPITAL_COMMUNITY): Payer: Self-pay | Admitting: Physical Therapy

## 2023-10-30 NOTE — Therapy (Signed)
 PHYSICAL THERAPY DISCHARGE SUMMARY  Visits from Start of Care: 1  Current functional level related to goals / functional outcomes: Unknown pt cancelled all appointments    Remaining deficits: incontinence   Education / Equipment: HEP   Patient agrees to discharge. Patient goals were not met. Patient is being discharged due to not returning since the last visit.  Leodis Rainwater, PT CLT [phone]

## 2023-10-31 ENCOUNTER — Encounter (HOSPITAL_COMMUNITY): Admitting: Physical Therapy

## 2023-10-31 LAB — BASIC METABOLIC PANEL WITH GFR
BUN/Creatinine Ratio: 16 (ref 9–23)
BUN: 13 mg/dL (ref 6–24)
CO2: 21 mmol/L (ref 20–29)
Calcium: 9.4 mg/dL (ref 8.7–10.2)
Chloride: 104 mmol/L (ref 96–106)
Creatinine, Ser: 0.81 mg/dL (ref 0.57–1.00)
Glucose: 91 mg/dL (ref 70–99)
Potassium: 4.4 mmol/L (ref 3.5–5.2)
Sodium: 141 mmol/L (ref 134–144)
eGFR: 87 mL/min/{1.73_m2} (ref >59)

## 2023-10-31 LAB — LIPID PANEL
Chol/HDL Ratio: 1.6 ratio (ref 0.0–4.4)
Cholesterol, Total: 116 mg/dL (ref 100–199)
HDL: 74 mg/dL (ref >39)
LDL Chol Calc (NIH): 31 mg/dL (ref 0–99)
Triglycerides: 42 mg/dL (ref 0–149)
VLDL Cholesterol Cal: 11 mg/dL (ref 5–40)

## 2023-10-31 LAB — MICROALBUMIN / CREATININE URINE RATIO
Creatinine, Urine: 257.9 mg/dL
Microalb/Creat Ratio: 6 mg/g{creat} (ref 0–29)
Microalbumin, Urine: 14.9 ug/mL

## 2023-11-05 ENCOUNTER — Ambulatory Visit: Payer: 59 | Admitting: Family Medicine

## 2023-11-05 VITALS — BP 123/83 | HR 87 | Temp 99.3°F | Ht 64.0 in | Wt 192.8 lb

## 2023-11-05 DIAGNOSIS — F439 Reaction to severe stress, unspecified: Secondary | ICD-10-CM

## 2023-11-05 DIAGNOSIS — I10 Essential (primary) hypertension: Secondary | ICD-10-CM

## 2023-11-05 DIAGNOSIS — G4730 Sleep apnea, unspecified: Secondary | ICD-10-CM

## 2023-11-05 DIAGNOSIS — R5383 Other fatigue: Secondary | ICD-10-CM | POA: Diagnosis not present

## 2023-11-05 MED ORDER — AZELASTINE HCL 0.1 % NA SOLN: 2.0000 | Freq: Two times a day (BID) | NASAL | 12 refills | Status: AC

## 2023-11-05 MED ORDER — LOSARTAN POTASSIUM-HCTZ 50-12.5 MG PO TABS: 1.0000 | ORAL_TABLET | Freq: Every day | ORAL | 1 refills | Status: DC

## 2023-11-05 MED ORDER — ESCITALOPRAM OXALATE 20 MG PO TABS: 20.0000 mg | ORAL_TABLET | Freq: Every day | ORAL | 1 refills | Status: AC

## 2023-11-05 NOTE — Progress Notes (Addendum)
 Subjective:    Patient ID: Jillian Ford, female    DOB: 06-14-1971, 53 y.o.   MRN: [id number]  HPI Pt come sin today for 6 month follow up on Hypertension. Pt does not have nay questions or concerns at this time. Medications and allergies reviewed. Discussed the use of AI scribe software for clinical note transcription with the patient, who gave verbal consent to proceed.  History of Present Illness   Jillian Ford is a 53 year old female with sleep apnea who presents with persistent fatigue.  She has been experiencing persistent low energy for the past three to four months. The fatigue is pervasive throughout the day, despite sleeping eight to nine hours at night. She often feels tired, yawns frequently, and does not feel refreshed upon waking. No sleepiness while driving short distances, but she may feel sleepy on longer trips.  She has a history of sleep apnea diagnosed in 2016, for which she was prescribed a CPAP machine that she does not currently use. She reports snoring and has been told that her snoring is erratic and that she sometimes stops breathing during sleep. Her sleep schedule includes going to bed by 9:30 PM and waking up at 6:30 AM on workdays, with later wake times on weekends. She typically falls asleep quickly unless she has taken a nap during the day. She wakes up once or twice at night, usually to use the bathroom.  She maintains a regular meal schedule and reports not feeling up to physical activities like walking on weekends due to feeling 'washed out'. She has a history of high blood pressure and is on regular medication for it. She also takes escitalopram  regularly. Recent blood work was done to monitor kidney function, cholesterol, and to rule out anemia and thyroid issues.  She reports postnasal drip for the past month, initially thought to be strep throat, but was diagnosed as postnasal drip at urgent care. She experiences drainage and hardened mucus in her mouth  upon waking. She uses Flonase  but finds it ineffective. She does not take any allergy tablets.       Review of Systems     Objective:   Physical Exam General-in no acute distress Eyes-no discharge Lungs-respiratory rate normal, CTA CV-no murmurs,RRR Extremities skin warm dry no edema Neuro grossly normal Behavior normal, alert        Assessment & Plan:  1. Stress Well with the Lexapro  continue Lexapro  - escitalopram  (LEXAPRO ) 20 MG tablet; Take 1 tablet (20 mg total) by mouth at bedtime.  Dispense: 90 tablet; Refill: 1  2. Primary hypertension Blood pressure under good control HTN- patient seen for follow-up regarding HTN.   Diet, medication compliance, appropriate labs and refills were completed.   Importance of keeping blood pressure under good control to lessen the risk of complications discussed Regular follow-up visits discussed  - losartan -hydrochlorothiazide (HYZAAR) 50-12.5 MG tablet; Take 1 tablet by mouth daily.  Dispense: 90 tablet; Refill: 1  3. Essential hypertension (Primary) Patient trying to stay active  4. Other fatigue Concern for probable sleep apnea Has a lot of the symptoms that go along with sleep apnea including snoring, daytime somnolence, fatigue tiredness Will do lab work to rule out other causes Patient does have a history of sleep apnea She states she got a sleep apnea machine back in 2016 but she has not used it Referral to the sleep study neurology's consultation - CBC - TSH + free T4 - Ambulatory referral to Sleep Studies  Otherwise follow-up 6 months  Also Astelin  along with loratadine for allergy symptoms for postnasal drainage but I believe that part of her problem is snoring and mouth breathing is causing thick phlegm

## 2023-11-05 NOTE — Patient Instructions (Signed)

## 2023-11-06 ENCOUNTER — Encounter: Payer: Self-pay | Admitting: Family Medicine

## 2023-11-06 LAB — CBC
Hematocrit: 46.1 % (ref 34.0–46.6)
Hemoglobin: 15.4 g/dL (ref 11.1–15.9)
MCH: 31.6 pg (ref 26.6–33.0)
MCHC: 33.4 g/dL (ref 31.5–35.7)
MCV: 95 fL (ref 79–97)
Platelets: 344 10*3/uL (ref 150–450)
RBC: 4.87 x10E6/uL (ref 3.77–5.28)
RDW: 12.3 % (ref 11.7–15.4)
WBC: 7.6 10*3/uL (ref 3.4–10.8)

## 2023-11-06 LAB — TSH+FREE T4
Free T4: 1.12 ng/dL (ref 0.82–1.77)
TSH: 1.45 u[IU]/mL (ref 0.450–4.500)

## 2023-11-21 ENCOUNTER — Encounter (HOSPITAL_COMMUNITY): Admitting: Physical Therapy

## 2023-11-28 ENCOUNTER — Encounter (HOSPITAL_COMMUNITY): Admitting: Physical Therapy

## 2023-12-05 ENCOUNTER — Encounter (HOSPITAL_COMMUNITY): Admitting: Physical Therapy

## 2023-12-12 ENCOUNTER — Encounter (HOSPITAL_COMMUNITY): Admitting: Physical Therapy

## 2023-12-18 ENCOUNTER — Ambulatory Visit (INDEPENDENT_AMBULATORY_CARE_PROVIDER_SITE_OTHER): Admitting: Neurology

## 2023-12-18 ENCOUNTER — Encounter: Payer: Self-pay | Admitting: Neurology

## 2023-12-18 VITALS — BP 133/97 | HR 88 | Ht 63.0 in | Wt 196.0 lb

## 2023-12-18 DIAGNOSIS — E669 Obesity, unspecified: Secondary | ICD-10-CM

## 2023-12-18 DIAGNOSIS — R632 Polyphagia: Secondary | ICD-10-CM

## 2023-12-18 DIAGNOSIS — G4710 Hypersomnia, unspecified: Secondary | ICD-10-CM | POA: Diagnosis not present

## 2023-12-18 DIAGNOSIS — F458 Other somatoform disorders: Secondary | ICD-10-CM

## 2023-12-18 DIAGNOSIS — G4730 Sleep apnea, unspecified: Secondary | ICD-10-CM | POA: Diagnosis not present

## 2023-12-18 DIAGNOSIS — Z789 Other specified health status: Secondary | ICD-10-CM

## 2023-12-18 DIAGNOSIS — R232 Flushing: Secondary | ICD-10-CM | POA: Diagnosis not present

## 2023-12-18 DIAGNOSIS — J302 Other seasonal allergic rhinitis: Secondary | ICD-10-CM

## 2023-12-18 NOTE — Progress Notes (Signed)
 SLEEP MED[facility]    Provider:  Neomia Banner, MD  Primary Care Physician:  Bennet Brasil, MD [address] [place] [address]     Referring Provider: Bennet Brasil, Md [address] [place],  [place] [address]          Chief Complaint according to patient   Patient presents with:     New Patient (Initial Visit)           HISTORY OF PRESENT ILLNESS:  Jillian Ford is a 53 y.o. female patient who is seen upon referral on 12/18/2023 from Dr Fairy Homer  for a TOC of CPAP  in OSA.Aaron Aas  Chief concern according to patient :  " I never remembered seeing Dr Joleen Navy, but had a sleep test, 10-30-2014  was told I had apnea, ( AHI  15/ h )  REM sleep AHI  29/h, and put on CPAP,  and had no new evaluation since". What brought the sleep study on was an all- girls-trip during which she shared a room with GF and they all witnessed my snoring and apnea.  Epworth SS was 15,  she was titrated to 8 cm water CPAP in a second in- lab study.  She has gotten only one machine in 10 years but now reports aerophagia.     I have the pleasure of seeing Jillian Ford 12/18/23 a right -handed female with a possibly persistent  sleep disorder.      Sleep relevant medical history: Nocturia 0-2,  allergic rhinitis , Tonsillectomy, cervical spine DDD, HTN,  no DM,   Family medical /sleep history: PGM and father were likely affected by OSA father died at 83 of stroke, PGM died at 85, aneurysm.  No other family member on CPAP with OSA, insomnia, sleep walkers.    Social history:  Patient is working as an Metallurgist ( Programme researcher, broadcasting/film/video)  and lives in a household with spouse,  one cat.   Adult children - living close by. Tobacco use: none .  ETOH use : less than 2 / week ,  Caffeine intake in form of Coffee( /) Soda( 1 a day) Tea ( /) , red bull energy drinks- max 2  a day Exercise : none .   Hobbies : golf       Sleep habits are as follows: The patient's dinner time is between  6-6.30 PM. The patient goes to sleep by 9  watching TV , changes to  bed at 10 PM and continues to sleep for 6-7 hours, wakes for 1-2 bathroom breaks, the first time at 2 AM.  The bedroom is dark and quiet and cool. A sound machine is used to drawn out her snoring . The preferred sleep position is side ways, with the support of 1 pillow, flat bed,  no GERD.  Dreams are reportedly frequent/vivid.   The patient wakes up with an alarm at 6. 30,  several snoozes- than up at 6.45  AM is the usual rise time. He/ She reports not feeling refreshed or restored in AM, with symptoms such as dry mouth, morning headaches, and  dizziness when rising , having morning residual fatigue.   Naps are taken frequently on Week ends , lasting from 1 to 2 hours  and are  refreshing than nocturnal sleep.    Review of Systems: Out of a complete 14 system review, the patient complains of only the following symptoms, and all other reviewed  systems are negative.:  Fatigue, sleepiness , snoring, fragmented sleep,   How likely are you to doze in the following situations: 0 = not likely, 1 = slight chance, 2 = moderate chance, 3 = high chance     Sitting and Reading? Watching Television? Sitting inactive in a public place (theater or meeting)? As a passenger in a car for an hour without a break? Lying down in the afternoon when circumstances permit? Sitting and talking to someone? Sitting quietly after lunch without alcohol? In a car, while stopped for a few minutes in traffic?   Total = 13/ 24 points   FSS endorsed at 54/ 63 points.   AEROPHAGIA   Social History   Socioeconomic History   Marital status: Married    Spouse name: Not on file   Number of children: Not on file   Years of education: Not on file   Highest education level: Some college, no degree  Occupational History   Not on file  Tobacco Use   Smoking status: Never   Smokeless tobacco: Never  Vaping Use   Vaping status: Never Used   Substance and Sexual Activity   Alcohol use: Yes    Comment: social   Drug use: Never   Sexual activity: Yes    Birth control/protection: Pill  Other Topics Concern   Not on file  Social History Narrative   Not on file   Social Drivers of Health   Financial Resource Strain: Low Risk  (09/09/2023)   Overall Financial Resource Strain (CARDIA)    Difficulty of Paying Living Expenses: Not hard at all  Food Insecurity: No Food Insecurity (09/09/2023)   Hunger Vital Sign    Worried About Running Out of Food in the Last Year: Never true    Ran Out of Food in the Last Year: Never true  Transportation Needs: No Transportation Needs (09/09/2023)   PRAPARE - Administrator, Civil Service (Medical): No    Lack of Transportation (Non-Medical): No  Physical Activity: Inactive (09/09/2023)   Exercise Vital Sign    Days of Exercise per Week: 0 days    Minutes of Exercise per Session: 0 min  Stress: Stress Concern Present (09/09/2023)   [name] of Occupational Health - Occupational Stress Questionnaire    Feeling of Stress : To some extent  Social Connections: Socially Integrated (09/09/2023)   Social Connection and Isolation Panel [NHANES]    Frequency of Communication with Friends and Family: Twice a week    Frequency of Social Gatherings with Friends and Family: Once a week    Attends Religious Services: 1 to 4 times per year    Active Member of [employer] or Organizations: Patient declined    Attends Engineer, structural: 1 to 4 times per year    Marital Status: Married    Family History  Problem Relation Age of Onset   Hypertension Mother    Other Mother        brain injury   Alcohol abuse Father    Stroke Father    Early death Father    Hypertension Father    Sleep apnea Father    Dementia Maternal Grandmother    Heart attack Paternal Grandmother     Past Medical History:  Diagnosis Date   Anxiety    Back pain    Depression    Edema of both lower  extremities    GERD (gastroesophageal reflux disease)    Hypertension    IBS (  irritable bowel syndrome)    Palpitations    Sleep apnea    SOB (shortness of breath)     No past surgical history on file.   Current Outpatient Medications on File Prior to Visit  Medication Sig Dispense Refill   acetaminophen (TYLENOL) 325 MG tablet Take 650 mg by mouth every 6 (six) hours as needed.     azelastine  (ASTELIN ) 0.1 % nasal spray Place 2 sprays into both nostrils 2 (two) times daily. 30 mL 12   calcium carbonate (TUMS - DOSED IN MG ELEMENTAL CALCIUM) 500 MG chewable tablet Chew 1 tablet by mouth daily.     Drospirenone-Estetrol (NEXTSTELLIS ) 3-14.2 MG TABS Take 1 tablet by mouth daily.     escitalopram  (LEXAPRO ) 20 MG tablet Take 1 tablet (20 mg total) by mouth at bedtime. 90 tablet 1   ibuprofen  (ADVIL ) 200 MG tablet Take 400 mg by mouth as needed.     losartan -hydrochlorothiazide (HYZAAR) 50-12.5 MG tablet Take 1 tablet by mouth daily. 90 tablet 1   meclizine  (ANTIVERT ) 12.5 MG tablet Take 1 tablet (12.5 mg total) by mouth 3 (three) times daily as needed for dizziness. Do not take with alcohol or while driving or operating heavy machinery.  May cause drowsiness. 30 tablet 0   valACYclovir  (VALTREX ) 1000 MG tablet TAKE 2 TABLETS BY MOUTH AT THE FIRST SIGN OF COLD SORE AND REPEAT IN 12 HOURSUSE AS NEEDED 12 tablet 5   No current facility-administered medications on file prior to visit.    Allergies  Allergen Reactions   Wellbutrin  [Bupropion ] Itching and Rash     DIAGNOSTIC DATA (LABS, IMAGING, TESTING) - I reviewed patient records, labs, notes, testing and imaging myself where available.  Lab Results  Component Value Date   WBC 7.6 11/05/2023   HGB 15.4 11/05/2023   HCT 46.1 11/05/2023   MCV 95 11/05/2023   PLT 344 11/05/2023      Component Value Date/Time   NA 141 10/30/2023 0822   K 4.4 10/30/2023 0822   CL 104 10/30/2023 0822   CO2 21 10/30/2023 0822   GLUCOSE 91 10/30/2023  0822   GLUCOSE 133 (H) 03/14/2016 0024   BUN 13 10/30/2023 0822   CREATININE 0.81 10/30/2023 0822   CREATININE 0.68 08/11/2014 1032   CALCIUM 9.4 10/30/2023 0822   PROT 7.1 11/08/2022 1513   ALBUMIN 4.3 11/08/2022 1513   AST 14 11/08/2022 1513   ALT 17 11/08/2022 1513   ALKPHOS 78 11/08/2022 1513   BILITOT 0.4 11/08/2022 1513   GFRNONAA 91 06/13/2020 0810   GFRAA 105 06/13/2020 0810   Lab Results  Component Value Date   CHOL 116 10/30/2023   HDL 74 10/30/2023   LDLCALC 31 10/30/2023   TRIG 42 10/30/2023   CHOLHDL 1.6 10/30/2023   Lab Results  Component Value Date   HGBA1C 5.4 02/28/2022   Lab Results  Component Value Date   VITAMINB12 516 02/28/2022   Lab Results  Component Value Date   TSH 1.450 11/05/2023    PHYSICAL EXAM:  Today's Vitals   12/18/23 1036  BP: (!) 133/97  Pulse: 88  Weight: 196 lb (88.9 kg)  Height: 5\' 3"  (1.6 m)   Body mass index is 34.72 kg/m.   Wt Readings from Last 3 Encounters:  12/18/23 196 lb (88.9 kg)  11/05/23 192 lb 12.8 oz (87.5 kg)  09/09/23 192 lb 8 oz (87.3 kg)     Ht Readings from Last 3 Encounters:  12/18/23 5\' 3"  (1.6 m)  11/05/23 5\' 4"  (1.626 m)  09/09/23 5\' 4"  (1.626 m)      General: The patient is awake, alert and appears not in acute distress. The patient is well groomed. Head: Normocephalic, atraumatic. Neck is supple. Mallampati 3,  neck circumference:16 inches .  Nasal airflow is patent.   Retrognathia is not  seen.  Dental status:  underbite , biological  Cardiovascular:  Regular rate and cardiac rhythm by pulse,  without distended neck veins. Respiratory: Lungs are clear to auscultation.  Skin:  Without evidence of ankle edema, or rash. Trunk: The patient's posture is erect.   NEUROLOGIC EXAM: The patient is awake and alert, oriented to place and time.   Memory subjective described as intact.  Attention span & concentration ability appears normal.  Speech is fluent,  without  dysarthria, dysphonia  or aphasia.  Mood and affect are appropriate.   Cranial nerves: no loss of smell or taste reported  Pupils are equal and briskly reactive to light. Funduscopic exam deferred.  Extraocular movements in vertical and horizontal planes were intact and without nystagmus. No Diplopia. Visual fields by finger perimetry are intact. Hearing was intact to soft voice and finger rubbing.    Facial sensation intact to fine touch. Facial motor strength is symmetric and tongue and uvula move midline.  Neck ROM : rotation, tilt and flexion extension were normal for age and shoulder shrug was symmetrical.    Motor exam:  Symmetric bulk, tone and ROM.   Normal tone symmetric grip strength .   Toe and heel walk were deferred.  Deep tendon reflexes: in the  upper and lower extremities are symmetric and intact.    ASSESSMENT AND PLAN 53 y.o. year old female  here with:    1) longstanding OSA history which was mild- moderate and REM sleep dependent in 2016 - BMI 32 , 187 pounds.  she started on a set VCPAP pressure which has never changed, 8 cm water,  now presenting with recurrent sleepiness, break through  snoring , Eson nasal mask.   1) She reports aerophagia , still has Hypersomnia. , she gained about 12 pounds   2) she has  been a habitual mouth breather, and she has seasonally rhinitis  , sinusitis.   3) She wakes with morning headaches and a sore throat independent  of the season.    My plan for this 53 year old Caucasian right-handed female with a almost 10-year history of obstructive sleep apnea is to get a new baseline.  A sleep study should help us  to determine if she is in continued need of CPAP, how severely apnea is, if there are central or all obstructive events present, in addition we may find that it is a positional component that exacerbates her apnea and we can also discuss weight loss strategies that reduce REM sleep dependent apnea the best.  Mild sleep apnea can also respond to a  dental device and does not necessarily mean CPAP is necessary.  The morning headaches leads me to believe that she either has sinusitis or hypoxia at night and hypoxia at night also leads to more sleepiness and fatigue and daytime.   So I will order a home sleep test and since the patient lives further away from our office I can offer her a Mayo in device if she is able to pick up the device here and return the device the next morning she would have the results earlier both are options for her.  The reported  aerophagia  is a symptom associated with too high a CPAP pressure.  It leads to the feeling of bloating of swallowing air of gulping and often of abdominal discomfort.  With the use of new auto titration machines these side effects have become increasingly rare.  The patient is well aware of sleep hygiene, that she should be sleeping before midnight, she is trying to get at least 7 hours of sleep, I will advise her to rather not use energy drinks but do her own tea for her own coffee in order to have some caffeine intake.  And this should not be later than 2 or 3 PM as it can interfere with sleep onset.  As to weight loss there have been developments as the FDA approved a form of Mounjaro for weight loss in patients that have obstructive sleep apnea to and at least moderate severity.  Patients that have REM sleep dependent apnea are usually having the main risk factor of abdominal obesity and also can benefit from these medications.  I plan to follow up either personally or through our NP within 3-5 months.   I would like to thank Bennet Brasil, MD and Bennet Brasil, Md [address] [place],  [place] [address] for allowing me to meet with and to take care of this pleasant patient.    After spending a total time of  45  minutes face to face and additional time for physical and neurologic examination, review of laboratory studies,  personal review of imaging studies, reports and results  of other testing and review of referral information / records as far as provided in visit,   Electronically signed by: Neomia Banner, MD 12/18/2023 10:48 AM  [facility] Board certified by The [employer] of Sleep Medicine and Diplomate of the [employer] of Sleep Medicine. Board certified In Neurology through the ABPN, Fellow of the [employer] of Neurology.

## 2023-12-18 NOTE — Patient Instructions (Signed)
 ASSESSMENT AND PLAN 53 y.o. year old female  here with:     1) longstanding OSA history which was mild- moderate and REM sleep dependent in 2016 - BMI 32 , 187 pounds.  she started on a set VCPAP pressure which has never changed, 8 cm water,  now presenting with recurrent sleepiness, break through  snoring , Eson nasal mask.    1) She reports aerophagia , still has Hypersomnia. , she gained about 12 pounds    2) she has  been a habitual mouth breather, and she has seasonally rhinitis  , sinusitis.    3) She wakes with morning headaches and a sore throat independent  of the season.      My plan for this 53 year old Caucasian right-handed female with a almost 10-year history of obstructive sleep apnea is to get a new baseline.  A sleep study should help us  to determine if she is in continued need of CPAP, how severely apnea is, if there are central or all obstructive events present, in addition we may find that it is a positional component that exacerbates her apnea and we can also discuss weight loss strategies that reduce REM sleep dependent apnea the best.  Mild sleep apnea can also respond to a dental device and does not necessarily mean CPAP is necessary.  The morning headaches leads me to believe that she either has sinusitis or hypoxia at night and hypoxia at night also leads to more sleepiness and fatigue and daytime.     So I will order a home sleep test and since the patient lives further away from our office I can offer her a Mayo in device if she is able to pick up the device here and return the device the next morning she would have the results earlier both are options for her.  The reported  aerophagia is a symptom associated with too high a CPAP pressure.  It leads to the feeling of bloating of swallowing air of gulping and often of abdominal discomfort.  With the use of new auto titration machines these side effects have become increasingly rare.   The patient is well aware of sleep  hygiene, that she should be sleeping before midnight, she is trying to get at least 7 hours of sleep, I will advise her to rather not use energy drinks but do her own tea for her own coffee in order to have some caffeine intake.  And this should not be later than 2 or 3 PM as it can interfere with sleep onset.   As to weight loss there have been developments as the FDA approved a form of Mounjaro for weight loss in patients that have obstructive sleep apnea to and at least moderate severity.  Patients that have REM sleep dependent apnea are usually having the main risk factor of abdominal obesity and also can benefit from these medications.   I plan to follow up either personally or through our NP within 3-5 months.

## 2023-12-18 NOTE — Addendum Note (Signed)
 Addended by: Neomia Banner on: 12/18/2023 11:25 AM   Modules accepted: Orders

## 2023-12-19 ENCOUNTER — Encounter (HOSPITAL_COMMUNITY): Admitting: Physical Therapy

## 2024-01-07 ENCOUNTER — Ambulatory Visit: Admitting: Neurology

## 2024-01-07 DIAGNOSIS — G4733 Obstructive sleep apnea (adult) (pediatric): Secondary | ICD-10-CM | POA: Diagnosis not present

## 2024-01-07 DIAGNOSIS — R632 Polyphagia: Secondary | ICD-10-CM

## 2024-01-07 DIAGNOSIS — J302 Other seasonal allergic rhinitis: Secondary | ICD-10-CM

## 2024-01-07 DIAGNOSIS — Z789 Other specified health status: Secondary | ICD-10-CM

## 2024-01-07 DIAGNOSIS — E669 Obesity, unspecified: Secondary | ICD-10-CM

## 2024-01-07 DIAGNOSIS — F458 Other somatoform disorders: Secondary | ICD-10-CM

## 2024-01-07 DIAGNOSIS — G4710 Hypersomnia, unspecified: Secondary | ICD-10-CM

## 2024-01-07 DIAGNOSIS — R232 Flushing: Secondary | ICD-10-CM

## 2024-01-07 NOTE — Progress Notes (Signed)
 Name: Jillian Ford DOB: 08/29/1970 MRN: [id number]  History of Present Illness: Jillian Ford is a 53 y.o. female who presents today for follow up visit at [facility].  Relevant History includes: 1. Stress urinary incontinence. 2. OAB with urinary urgency and occasional nocturia / occasional urge incontinence. - Likely exacerbated by caffeine intake.  - Not significantly bothersome per patient.  At 1st visit on 10/08/2023: She elected pelvic floor physical therapy for her SUI.  Since last visit: > 10/24/2023: Initial pelvic floor physical therapy evaluation for SUI.  > 12/18/2023: Seen by sleep specialist; being scheduled for home sleep study with goal of restarting CPAP for OSA.  Today: She reports persistent bother from stress urinary incontinence; was only able to go to pelvic floor physical therapy due to cost.   Also reports ongoing mild urinary frequency, urgency, and occasional urge incontinence; not significantly bothersome. Reports drinking caffeine.   She denies dysuria, gross hematuria, straining to void, or sensations of incomplete emptying.   Medications: Current Outpatient Medications  Medication Sig Dispense Refill   acetaminophen (TYLENOL) 325 MG tablet Take 650 mg by mouth every 6 (six) hours as needed.     azelastine  (ASTELIN ) 0.1 % nasal spray Place 2 sprays into both nostrils 2 (two) times daily. 30 mL 12   calcium carbonate (TUMS - DOSED IN MG ELEMENTAL CALCIUM) 500 MG chewable tablet Chew 1 tablet by mouth daily.     Drospirenone-Estetrol (NEXTSTELLIS ) 3-14.2 MG TABS Take 1 tablet by mouth daily.     escitalopram  (LEXAPRO ) 20 MG tablet Take 1 tablet (20 mg total) by mouth at bedtime. 90 tablet 1   ibuprofen  (ADVIL ) 200 MG tablet Take 400 mg by mouth as needed.     losartan -hydrochlorothiazide (HYZAAR) 50-12.5 MG tablet Take 1 tablet by mouth daily. 90 tablet 1   meclizine  (ANTIVERT ) 12.5 MG tablet Take 1 tablet (12.5 mg total) by mouth 3  (three) times daily as needed for dizziness. Do not take with alcohol or while driving or operating heavy machinery.  May cause drowsiness. 30 tablet 0   valACYclovir  (VALTREX ) 1000 MG tablet TAKE 2 TABLETS BY MOUTH AT THE FIRST SIGN OF COLD SORE AND REPEAT IN 12 HOURSUSE AS NEEDED 12 tablet 5   No current facility-administered medications for this visit.    Allergies: Allergies  Allergen Reactions   Wellbutrin  [Bupropion ] Itching and Rash    Past Medical History:  Diagnosis Date   Anxiety    Back pain    Depression    Edema of both lower extremities    GERD (gastroesophageal reflux disease)    Hypertension    IBS (irritable bowel syndrome)    Palpitations    Sleep apnea    SOB (shortness of breath)    History reviewed. No pertinent surgical history. Family History  Problem Relation Age of Onset   Hypertension Mother    Other Mother        brain injury   Alcohol abuse Father    Stroke Father    Early death Father    Hypertension Father    Sleep apnea Father    Dementia Maternal Grandmother    Heart attack Paternal Grandmother    Social History   Socioeconomic History   Marital status: Married    Spouse name: Not on file   Number of children: Not on file   Years of education: Not on file   Highest education level: Some college, no degree  Occupational History  Not on file  Tobacco Use   Smoking status: Never   Smokeless tobacco: Never  Vaping Use   Vaping status: Never Used  Substance and Sexual Activity   Alcohol use: Yes    Comment: social   Drug use: Never   Sexual activity: Yes    Birth control/protection: Pill  Other Topics Concern   Not on file  Social History Narrative   Not on file   Social Drivers of Health   Financial Resource Strain: Low Risk  (09/09/2023)   Overall Financial Resource Strain (CARDIA)    Difficulty of Paying Living Expenses: Not hard at all  Food Insecurity: No Food Insecurity (09/09/2023)   Hunger Vital Sign    Worried  About Running Out of Food in the Last Year: Never true    Ran Out of Food in the Last Year: Never true  Transportation Needs: No Transportation Needs (09/09/2023)   PRAPARE - Administrator, Civil Service (Medical): No    Lack of Transportation (Non-Medical): No  Physical Activity: Inactive (09/09/2023)   Exercise Vital Sign    Days of Exercise per Week: 0 days    Minutes of Exercise per Session: 0 min  Stress: Stress Concern Present (09/09/2023)   [name] of Occupational Health - Occupational Stress Questionnaire    Feeling of Stress : To some extent  Social Connections: Socially Integrated (09/09/2023)   Social Connection and Isolation Panel    Frequency of Communication with Friends and Family: Twice a week    Frequency of Social Gatherings with Friends and Family: Once a week    Attends Religious Services: 1 to 4 times per year    Active Member of [employer] or Organizations: Patient declined    Attends Banker Meetings: 1 to 4 times per year    Marital Status: Married  Catering manager Violence: Not At Risk (09/09/2023)   Humiliation, Afraid, Rape, and Kick questionnaire    Fear of Current or Ex-Partner: No    Emotionally Abused: No    Physically Abused: No    Sexually Abused: No    Review of Systems Constitutional: Patient denies any unintentional weight loss or change in strength lntegumentary: Patient denies any rashes or pruritus Cardiovascular: Patient denies chest pain or syncope Respiratory: Patient denies shortness of breath Gastrointestinal: Patient denies nausea, vomiting, constipation, or diarrhea  Musculoskeletal: Patient denies muscle cramps or weakness Neurologic: Patient denies convulsions or seizures Allergic/Immunologic: Patient denies recent allergic reaction(s) Hematologic/Lymphatic: Patient denies bleeding tendencies Endocrine: Patient denies heat/cold intolerance  GU: As per HPI.  OBJECTIVE Vitals:   01/10/24 0828  BP:  136/86  Pulse: 96   There is no height or weight on file to calculate BMI.  Physical Examination Constitutional: No obvious distress; patient is non-toxic appearing  Cardiovascular: No visible lower extremity edema.  Respiratory: The patient does not have audible wheezing/stridor; respirations do not appear labored  Gastrointestinal: Abdomen non-distended Musculoskeletal: Normal ROM of UEs  Skin: No obvious rashes/open sores  Neurologic: CN 2-12 grossly intact Psychiatric: Answered questions appropriately with normal affect  Hematologic/Lymphatic/Immunologic: No obvious bruises or sites of spontaneous bleeding  ASSESSMENT Mixed stress and urge urinary incontinence - Plan: Urinalysis, Routine w reflex microscopic, BLADDER SCAN AMB NON-IMAGING, Ambulatory referral to Urology  1. The etiology of this condition was explained in detail to include pelvic floor muscle relaxation and detachment of the urethra away from its connection to the pubic bone. Her risk profile was reviewed, including childbirth.  The management  options were reviewed to include: No intervention, observation. Non-surgical options: Pelvic floor muscle rehabilitation Weight loss Incontinence pessary Surgical consultation   She elected to proceed with referral to [facility] for surgical consultation.  2. OAB:  We discussed the following management options in detail including potential benefits, risks, and side effects: Behavioral therapy: Modify fluid intake Minimize / avoid bladder irritants (such as caffeine, spicy foods, acidic foods, alcohol) Bladder retraining / timed voiding Double voiding Medication(s) She decided to proceed with behavioral modifications including minimizing / avoiding caffeine intake and working on timed voiding.  Will plan for follow up in 6 months or sooner if needed. Pt verbalized understanding and agreement. All questions were answered.  PLAN Advised the following: 1.  Referred to [facility] for SUI surgical consultation. 2. Minimize / avoid caffeine intake. 3. Work on timed voiding / bladder retraining. 4. Return in about 6 months (around 07/11/2024).  Orders Placed This Encounter  Procedures   Urinalysis, Routine w reflex microscopic   Ambulatory referral to Urology    Referral Priority:   Routine    Referral Type:   Consultation    Referral Reason:   Specialty Services Required    Requested Specialty:   Urology    Number of Visits Requested:   1   BLADDER SCAN AMB NON-IMAGING   Total time spent caring for the patient today was over 30 minutes. This includes time spent on the date of the visit reviewing the patient's chart before the visit, time spent during the visit, and time spent after the visit on documentation. Over 50% of that time was spent in face-to-face time with this patient for direct counseling. E&M based on time and complexity of medical decision making.  It has been explained that the patient is to follow regularly with their PCP in addition to all other providers involved in their care and to follow instructions provided by these respective offices. Patient advised to contact urology clinic if any urologic-pertaining questions, concerns, new symptoms or problems arise in the interim period.  Patient Instructions          Electronically signed by:  Lauraine JAYSON Oz, FNP   01/10/24    9:54 AM

## 2024-01-08 NOTE — Progress Notes (Signed)
 [facility]  Jillian Ford 53 year old female [birth year]   HOME SLEEP TEST REPORT ( by Watch PAT)   STUDY DATE:  01-08-2024   ORDERING CLINICIAN:  REFERRING CLINICIAN: Dr Alphonsa, MD    CLINICAL INFORMATION/HISTORY:  aerophagia concern -Jillian Ford is a 53 y.o. female patient who is seen upon referral on 12/18/2023 from Dr Alphonsa  for a TOC of CPAP  in OSA.Jillian Ford  Chief concern according to patient :   I never remembered seeing Dr Milton, but had a sleep test, 10-30-2014  was told I had apnea, ( AHI  15/ h )  REM sleep AHI  29/h, and put on CPAP,  and had no new evaluation since. What brought the sleep study on was an all- girls-trip during which she shared a room with GF and they all witnessed my snoring and apnea.  Epworth SS was 15,  she was titrated to 8 cm water CPAP in a second in- lab study.  She has gotten only one machine in 10 years but now reports aerophagia.  longstanding OSA history which was mild- moderate and REM sleep dependent in 2016 - BMI 32 , 187 pounds.  she started on a set VCPAP pressure which has never changed, 8 cm water,  now presenting with recurrent sleepiness, break through  snoring , Eson nasal mask.    1) She reports aerophagia , still has Hypersomnia. , she gained about 12 pounds    2) she has  been a habitual mouth breather, and she has seasonally rhinitis  , sinusitis.    3) She wakes with morning headaches and a sore throat independent  of the season.          Epworth sleepiness score: 13/ 24 points   FSS endorsed at 54/ 63 points.      BMI:  34.7 kg/m   Neck Circumference: 16   FINDINGS:   Sleep Summary:   Total Recording Time (hours, min):    8 hours 43 minutes   Total Sleep Time (hours, min):   8 hours 80 minutes              Percent REM (%):   14.6%    Sleep latency was 16 minutes but REM sleep latency was prolonged at 221 minutes.                                   Respiratory Indices:   Calculated  pAHI (per AASM guideline): Total 84.4/h                         REM pAHI:   82.5/h                                              NREM pAHI:    84.7/h                          Positional AHI:    This patient only slept 24 minutes in supine which was associated with a lower AHI than lateral sleep.  Sleep on the right side was associated with an AHI of 80 and sleep on the left side with  an AHI of 83.  Snoring: Reached a mean volume of 48 dB which is loud.  Snoring was present for just about 60% of the total recorded sleep time.                                                 Oxygen Saturation Statistics:   Oxygen Saturation (%) Mean:    89%, this is considered below normal.            O2 Saturation Range (%):       Between a nadir at 69 and a maximal saturation of 98%                                O2 Saturation (minutes) <89%:     Severe desaturation into total time in hypoxia was 156 minutes = 32% of the total sleep time      Pulse Rate Statistics:   Pulse Mean (bpm):   75 bpm              Pulse Range:    Between 59 and 117 beats per minute.    This device cannot provide cardiac rhythm data.           IMPRESSION:  This HST confirms the presence of severe sleep apnea and severe sleep hypoxemia requiring further evaluation.  As this patient has reported aerophagia my suspicion was that she may have been on too high pressure of positive airway therapy but now I believe she may not get enough.  The sleep was remarkably unfragmented.    RECOMMENDATION:  I would like this patient to come into the sleep lab for a titration to PAP ( CPAP / BiPAP) and if necessary to BiPAP.  This in-lab evaluation will also allow us  to see if the hypoxia is an artifact or based on correct data.  I have ordered an in lab titration for PAP therapy and for prn oxygen titration.     INTERPRETING PHYSICIAN:  Dedra Gores, MD  [facility] [place] Sleep Board certified by The  [employer] of Sleep Medicine and Diplomate of the [employer] of Sleep Medicine. Board certified In Neurology through the ABPN, Fellow of the [employer] of Neurology.

## 2024-01-10 ENCOUNTER — Encounter: Payer: Self-pay | Admitting: Urology

## 2024-01-10 ENCOUNTER — Ambulatory Visit (INDEPENDENT_AMBULATORY_CARE_PROVIDER_SITE_OTHER): Admitting: Urology

## 2024-01-10 VITALS — BP 136/86 | HR 96

## 2024-01-10 DIAGNOSIS — N3946 Mixed incontinence: Secondary | ICD-10-CM | POA: Diagnosis not present

## 2024-01-10 DIAGNOSIS — N3281 Overactive bladder: Secondary | ICD-10-CM

## 2024-01-10 LAB — URINALYSIS, ROUTINE W REFLEX MICROSCOPIC
Bilirubin, UA: NEGATIVE
Glucose, UA: NEGATIVE
Ketones, UA: NEGATIVE
Leukocytes,UA: NEGATIVE
Nitrite, UA: NEGATIVE
Protein,UA: NEGATIVE
RBC, UA: NEGATIVE
Specific Gravity, UA: 1.02 (ref 1.005–1.030)
Urobilinogen, Ur: 0.2 mg/dL (ref 0.2–1.0)
pH, UA: 7 (ref 5.0–7.5)

## 2024-01-10 LAB — BLADDER SCAN AMB NON-IMAGING: Scan Result: 0

## 2024-01-10 NOTE — Patient Instructions (Signed)
 SABRA

## 2024-01-10 NOTE — Progress Notes (Signed)
 Bladder Scan completed today.  Patient can void prior to the bladder scan. Bladder scan result: 0  Performed By: Exie T. CMA

## 2024-01-22 ENCOUNTER — Ambulatory Visit: Payer: Self-pay | Admitting: Neurology

## 2024-01-22 DIAGNOSIS — G4733 Obstructive sleep apnea (adult) (pediatric): Secondary | ICD-10-CM

## 2024-01-22 DIAGNOSIS — Z789 Other specified health status: Secondary | ICD-10-CM | POA: Insufficient documentation

## 2024-01-22 DIAGNOSIS — F458 Other somatoform disorders: Secondary | ICD-10-CM | POA: Insufficient documentation

## 2024-01-22 DIAGNOSIS — G4710 Hypersomnia, unspecified: Secondary | ICD-10-CM | POA: Insufficient documentation

## 2024-01-22 NOTE — Procedures (Signed)
 [facility]  Jaena Brocato Duhart 53 year old female [birth year]   HOME SLEEP TEST REPORT ( by Watch PAT)   STUDY DATE:  01-08-2024   ORDERING CLINICIAN:  REFERRING CLINICIAN: Dr Alphonsa, MD    CLINICAL INFORMATION/HISTORY:  aerophagia concern -Jillian Ford is a 53 y.o. female patient who is seen upon referral on 12/18/2023 from Dr Alphonsa  for a TOC of CPAP  in OSA.SABRA  Chief concern according to patient :   I never remembered seeing Dr Milton, but had a sleep test, 10-30-2014  was told I had apnea, ( AHI  15/ h )  REM sleep AHI  29/h, and put on CPAP,  and had no new evaluation since. What brought the sleep study on was an all- girls-trip during which she shared a room with GF and they all witnessed my snoring and apnea.  Epworth SS was 15,  she was titrated to 8 cm water CPAP in a second in- lab study.  She has gotten only one machine in 10 years but now reports aerophagia.  longstanding OSA history which was mild- moderate and REM sleep dependent in 2016 - BMI 32 , 187 pounds.  she started on a set VCPAP pressure which has never changed, 8 cm water,  now presenting with recurrent sleepiness, break through  snoring , Eson nasal mask.    1) She reports aerophagia , still has Hypersomnia. , she gained about 12 pounds    2) she has  been a habitual mouth breather, and she has seasonally rhinitis  , sinusitis.    3) She wakes with morning headaches and a sore throat independent  of the season.          Epworth sleepiness score: 13/ 24 points   FSS endorsed at 54/ 63 points.      BMI:  34.7 kg/m   Neck Circumference: 16   FINDINGS:   Sleep Summary:   Total Recording Time (hours, min):    8 hours 43 minutes   Total Sleep Time (hours, min):   8 hours 80 minutes              Percent REM (%):   14.6%    Sleep latency was 16 minutes but REM sleep latency was prolonged at 221 minutes.                                   Respiratory Indices:   Calculated  pAHI (per AASM guideline): Total 84.4/h                         REM pAHI:   82.5/h                                              NREM pAHI:    84.7/h                          Positional AHI:    This patient only slept 24 minutes in supine which was associated with a lower AHI than lateral sleep.  Sleep on the right side was associated with an AHI of 80 and sleep on the left side with  an AHI of 83.  Snoring: Reached a mean volume of 48 dB which is loud.  Snoring was present for just about 60% of the total recorded sleep time.                                                 Oxygen Saturation Statistics:   Oxygen Saturation (%) Mean:    89%, this is considered below normal.            O2 Saturation Range (%):       Between a nadir at 69 and a maximal saturation of 98%                                O2 Saturation (minutes) <89%:     Severe desaturation into total time in hypoxia was 156 minutes = 32% of the total sleep time      Pulse Rate Statistics:   Pulse Mean (bpm):   75 bpm              Pulse Range:    Between 59 and 117 beats per minute.    This device cannot provide cardiac rhythm data.           IMPRESSION:  This HST confirms the presence of severe sleep apnea and severe sleep hypoxemia requiring further evaluation.  As this patient has reported aerophagia my suspicion was that she may have been on too high pressure of positive airway therapy but now I believe she may not get enough.  The sleep was remarkably unfragmented.    RECOMMENDATION:  I would like this patient to come into the sleep lab for a titration to PAP ( CPAP / BiPAP) and if necessary to BiPAP.  This in-lab evaluation will also allow us  to see if the hypoxia is an artifact or based on correct data.  I have ordered an in lab titration for PAP therapy and for prn oxygen titration.     INTERPRETING PHYSICIAN:  Dedra Gores, MD  [facility] [place] Sleep Board certified by The  [employer] of Sleep Medicine and Diplomate of the [employer] of Sleep Medicine. Board certified In Neurology through the ABPN, Fellow of the [employer] of Neurology.

## 2024-01-23 NOTE — Telephone Encounter (Signed)
-----   Message from Etna Dohmeier sent at 01/22/2024  1:14 PM EDT -----  This HST confirms the presence of severe sleep apnea and severe sleep hypoxemia requiring further evaluation.  As this patient has reported aerophagia my suspicion was that she may have been on too  high pressure of positive airway therapy but now I believe she may not get enough.  The sleep was remarkably unfragmented.  O2 nadir was 62% and total hypoxemia time was 156 minutes,  AHI was  84/h !      RECOMMENDATION:   I would like this patient to come into the sleep lab for a titration to PAP ( CPAP / BiPAP) and if necessary to BiPAP.  This in-lab evaluation will also allow us  to see if the hypoxia is an artifact  or based on correct data.   I have ordered an in lab titration for PAP therapy and for prn oxygen titration.      ----- Message ----- From: Chalice Saunas, MD Sent: 01/22/2024   1:11 PM EDT To: Saunas Chalice, MD

## 2024-01-23 NOTE — Telephone Encounter (Signed)
 I called pt. I advised pt that Dr. Chalice reviewed their sleep study results and found that has severe sleep apnea and sever hypoxemia and recommends that pt be treated with a cpap. Dr. Chalice recommends that pt return for a repeat sleep study in order to properly titrate the cpap and ensure a good mask fit. Pt is agreeable to returning for a titration study. I advised pt that our sleep lab will file with pt's insurance and call pt to schedule the sleep study when we hear back from the pt's insurance regarding coverage of this sleep study. Pt verbalized understanding of results. Pt had no questions at this time but was encouraged to call back if questions arise.

## 2024-01-30 LAB — COLOGUARD: COLOGUARD: NEGATIVE

## 2024-01-31 ENCOUNTER — Ambulatory Visit: Payer: Self-pay | Admitting: Adult Health

## 2024-01-31 ENCOUNTER — Telehealth: Payer: Self-pay | Admitting: Neurology

## 2024-01-31 NOTE — Telephone Encounter (Signed)
 CPAP [facility] pending .

## 2024-02-06 NOTE — Telephone Encounter (Signed)
 CPAP [employer] shara: [id number] (exp. 01/31/24 to 05/01/24)

## 2024-02-11 NOTE — Telephone Encounter (Signed)
 CPAP [employer] shara: [id number] (exp. 01/31/24 to 05/01/24)   Patient is scheduled at [facility] for 03/04/2024 at 8 pm  Mailed packet and sent mychart

## 2024-03-03 ENCOUNTER — Other Ambulatory Visit: Payer: Self-pay

## 2024-03-03 ENCOUNTER — Encounter: Payer: Self-pay | Admitting: Family Medicine

## 2024-03-03 MED ORDER — TRIAMCINOLONE ACETONIDE 0.1 % EX CREA: 1.0000 | TOPICAL_CREAM | Freq: Two times a day (BID) | CUTANEOUS | 0 refills | Status: DC

## 2024-03-03 NOTE — Telephone Encounter (Signed)
 Nurses Please send in triamcinolone  0.1% apply mL twice daily as needed for itching 30 g tube  More than likely this was a insect bite-it can cause localized itching and swelling for several days.  If the itching is very intense may add Benadryl 25 mg tablet 1 every 6 hours as needed for severe itching but be aware Benadryl can cause significant drowsiness so it would be better to utilize a medication like that when home and not driving.  It will get better on its own without Benadryl.  It could take several days for the localized swelling and itching to go down but gradually will get better.  If these areas start becoming red and tender with pus or drainage that is a sign of localized infection and would need to be seen.  The likelihood of that happening is very low.  You may share this message with Kepple is to let us  know if any other ongoing troubles  Thanks-Dr. Glendia

## 2024-03-04 ENCOUNTER — Ambulatory Visit: Admitting: Neurology

## 2024-03-04 DIAGNOSIS — F458 Other somatoform disorders: Secondary | ICD-10-CM

## 2024-03-04 DIAGNOSIS — R632 Polyphagia: Secondary | ICD-10-CM

## 2024-03-04 DIAGNOSIS — G4710 Hypersomnia, unspecified: Secondary | ICD-10-CM

## 2024-03-04 DIAGNOSIS — G4734 Idiopathic sleep related nonobstructive alveolar hypoventilation: Secondary | ICD-10-CM | POA: Diagnosis not present

## 2024-03-04 DIAGNOSIS — E669 Obesity, unspecified: Secondary | ICD-10-CM

## 2024-03-04 DIAGNOSIS — G4733 Obstructive sleep apnea (adult) (pediatric): Secondary | ICD-10-CM

## 2024-03-09 ENCOUNTER — Ambulatory Visit: Admitting: Adult Health

## 2024-03-09 ENCOUNTER — Encounter: Payer: Self-pay | Admitting: Adult Health

## 2024-03-09 VITALS — BP 124/83 | HR 92 | Ht 64.0 in | Wt 201.0 lb

## 2024-03-09 DIAGNOSIS — Z3041 Encounter for surveillance of contraceptive pills: Secondary | ICD-10-CM | POA: Diagnosis not present

## 2024-03-09 MED ORDER — NEXTSTELLIS 3-14.2 MG PO TABS: 1.0000 | ORAL_TABLET | Freq: Every day | ORAL | Status: AC

## 2024-03-09 NOTE — Progress Notes (Signed)
  Subjective:     Patient ID: Jillian Ford, female   DOB: May 11, 1971, 53 y.o.   MRN: [id number]  HPI Jillian Ford is a 53 year old white female,married, G2P2002 back in follow up on nextstellis  and doing good, periods not heavy.      Component Value Date/Time   DIAGPAP  09/07/2022 1207    - Negative for intraepithelial lesion or malignancy (NILM)   HPVHIGH Negative 09/07/2022 1207   ADEQPAP  09/07/2022 1207    Satisfactory for evaluation; transformation zone component PRESENT.    PCP is Dr Alphonsa  Review of Systems Periods not heavy, doing good Reviewed past medical,surgical, social and family history. Reviewed medications and allergies.     Objective:   Physical Exam BP 124/83 (BP Location: Left Arm, Patient Position: Sitting, Cuff Size: Normal)   Pulse 92   Ht 5' 4 (1.626 m)   Wt 201 lb (91.2 kg)   LMP 02/10/2024 (Approximate)   BMI 34.50 kg/m     Skin warm and dry. Lungs: clear to ausculation bilaterally. Cardiovascular: regular rate and rhythm.  Fall risk is low  Upstream - 03/09/24 1624       Pregnancy Intention Screening   Does the patient want to become pregnant in the next year? No    Does the patient's partner want to become pregnant in the next year? No    Would the patient like to discuss contraceptive options today? No      Contraception Wrap Up   Current Method Oral Contraceptive    End Method Oral Contraceptive    Contraception Counseling Provided Yes          Assessment:     1. Encounter for surveillance of contraceptive pills (Primary) Periods not heavy on nextstellis , gave 6 sample packs to continue taking 1 daily    Plan:    Return in 6 months for physical

## 2024-03-11 ENCOUNTER — Ambulatory Visit: Payer: Self-pay | Admitting: Neurology

## 2024-03-11 DIAGNOSIS — G4710 Hypersomnia, unspecified: Secondary | ICD-10-CM

## 2024-03-11 DIAGNOSIS — G4700 Insomnia, unspecified: Secondary | ICD-10-CM

## 2024-03-11 DIAGNOSIS — G4733 Obstructive sleep apnea (adult) (pediatric): Secondary | ICD-10-CM | POA: Insufficient documentation

## 2024-03-11 DIAGNOSIS — R0683 Snoring: Secondary | ICD-10-CM

## 2024-03-11 NOTE — Telephone Encounter (Signed)
 I called pt. I advised pt that Dr. Chalice reviewed their sleep study results and found that pt was best treated with CPAP. Dr. Chalice recommends that pt starts auto CPAP. I reviewed PAP compliance expectations with the pt. Pt is agreeable to starting a CPAP. I advised pt that an order will be sent to a DME, [facility], and [facility] will call the pt within about one week after they file with the pt's insurance. [facility] will show the pt how to use the machine, fit for masks, and troubleshoot the CPAP if needed. A follow up appt was made for insurance purposes with Dr. Chalice  on 05/05/2024 at 8:30 am. Pt verbalized understanding to arrive 15 minutes early and bring their CPAP. Pt verbalized understanding of results. Pt had no questions at this time but was encouraged to call back if questions arise. I have sent the order to [facility] and have received confirmation that they have received the order.

## 2024-03-11 NOTE — Telephone Encounter (Signed)
-----   Message from Helotes Dohmeier sent at 03/11/2024  4:32 PM EDT ----- Severe apnea was confirmed again, but hypoxia was milder than HST indicated.  AHI was 30/h on 6 cm water , The patient was fitted with a FFM , titrated to 13 cm water and reached AHI of 0/h.   RECOMMENDATIONS: ResMed auto CPAP device with ramp from 6 cm water, pressure settings of 10 through 15 cm water, heated humidification, Vitera mask in medium size. Follow up with NP in 4 months.    Weight loss is a very important part of long term apnea reduction- this dx of severe OSA would allow for the use of weight loss medication following FDA approval for Zepbound use in obesity and  moderate - severe sleep apnea.    ----- Message ----- From: Rebecka Fleeta Higashi In One Three One Sent: 03/11/2024   4:14 PM EDT To: Dedra Gores, MD

## 2024-03-11 NOTE — Procedures (Signed)
 [facility] PAP TITRATION INTERPRETATION REPORT   STUDY DATE: 03/04/2024      PATIENT NAME:  Jillian Ford         DATE OF BIRTH:  January 19, 1971  PATIENT ID:  [id number]    TYPE OF STUDY:  CPAP  READING PHYSICIAN: DEDRA GORES, MD SCORING TECHNICIAN: Delon Sprung, RPSGT   HISTORY: This 53 year-old Female had used CPAP since 2016, TOC visit via Dr Alphonsa in June 2025, 01-07-2024 HST ordered: AHI was 84.4/h,  O2 nadir was 69 %  and total hypoxia time was 156 minutes (!) , snoring at 48 dB mean volume, here for attended titration with optional oxygen addition.  The Epworth Sleepiness Scale was endorsed at 13 out of 24 (scores above or equal to 10 are suggestive of hypersomnolence). FSS at 54/63 points. BMI 34.7, 16  neck,   DESCRIPTION: A sleep technologist was in attendance for the duration of the recording.  Data collection, scoring, video monitoring, and reporting were performed in compliance with the AASM Manual for the Scoring of Sleep and Associated Events; (Hypopnea is scored based on the criteria listed in Section VIII D. 1b in the AASM Manual V2.6 using a 4% oxygen desaturation rule or Hypopnea is scored based on the criteria listed in Section VIII D. 1a in the AASM Manual V2.6 using 3% oxygen desaturation and /or arousal rule).  A physician certified by the American Board of Sleep Medicine reviewed each epoch of the study.  ADDITIONAL INFORMATION:  Height: 63.0 in Weight: 196 lb (BMI 34) Neck Size: 16.0 in    MEDICATIONS: Astelin , Drospirenone, Lexapro , Hyzaar, Antivert , Valtrex .   SLEEP CONTINUITY AND SLEEP ARCHITECTURE:  The patient received a VITERA FFM  by [employer], size medium-  started CPAP at 5 cm water and ended at 13 cm water presure.  Lights off was at 21:45: and lights on 05:06: (7.3 hours in bed). Total sleep time was 348.5 minutes with a  sleep efficiency at 79.0%. Sleep latency was 31.0 minutes.   Of the total sleep time,  the percentage of stage N1 sleep was 1.3%, stage N2 sleep was 41.0%, stage N3 sleep was 3.4%, and REM sleep was 54.2%. There were 11 Stage R periods observed on this study night, 21 awakenings (i.e. transitions to Stage W from any sleep stage), and 83.0 total stage transitions. Wake after sleep onset (WASO) time accounted for 61 minutes.  AROUSAL: There were 7.4 arousals/hour.  Of these, 15 were identified as respiratory-related arousals (2.6 /h), 0 were PLM-related arousals (0.0 /h), and 44 were non-specific arousals (7.6 /h)  RESPIRATORY MONITORING:  Based on AASM criteria (using a 3% oxygen desaturation and /or arousal rule for scoring hypopneas), there were 7 apneas (7 obstructive; 0 central; 0 mixed), and 30 hypopneas.  The Apnea index was 1.2/h. The Hypopnea index was 5.2/h.  The total AHI - apnea-hypopnea index- was 6.4/h  overall (5.6 supine, 6.9 non-supine; 6.3 REM, 5.1 supine REM). There were 0 respiratory effort-related arousals. Respiratory events were associated with oxyhemoglobin desaturations (nadir during sleep 78% from a mean of 95%. OXIMETRY: Total sleep time spent <89% on CPAP was 0.5 minutes, or 0.2% of total sleep time.  SLEEP position: supine 64 minutes (18.5%), non-supine 284.0 minutes (81.5%); right 123 minutes (35.3%), left 161 minutes (46.2%), and prone 00 minutes (0.0%). Total supine REM sleep time was 59 minutes (31.2% of total REM sleep). LIMB MOVEMENTS: There were 0 periodic limb movements of sleep (0.0/h). ECG:  The electrocardiogram documented normal sinus rhythm.  The average heart rate during sleep was 83 bpm.   The maximum heart rate during sleep was 98 bpm. The minimum heart rate during sleep was 62 bpm.  IMPRESSION:  This patient slept best at a CPAP pressure of 12 and 13 cm water without EPR , reaching high sleep efficiency ( asleep for 90% of recorded time), and reached under 13 cm water CPAP an AHI of 0.0/h.  Hypoxemia was mostly resolved with therapy.  The patient  slept on her side.  Sustained sleep required a minimum pressure of 9 cm water and allowed for REM rebound.  2. No significant periodic limb movements (PLMs) observed. 3.Loud snoring was confirmed and only reduced after the CPAP pressure exceeded 11 cm water.   RECOMMENDATIONS: ResMed auto CPAP device with ramp from 6 cm water, pressure settings of 10 through 15 cm water, heated humidification, Vitera mask in medium size. Follow up with NP in 4 months.   Weight loss is a very important part of long term apnea reduction- this dx of severe OSA would allow for the use of weight loss medication following FDA approval for Zepbound use in obesity and moderate - severe sleep apnea.    DEDRA GORES, MD          [facility] CPAP/Bilevel Report    General Information  Name: Jillian Ford, Jillian Ford BMI: 34 Physician: ,   ID: [id number] Height: 63 in Technician: Jackson Nest  Sex: Female Weight: 196 lb Record: [id number]  Age: 53 [Apr 20, 1971] Date: 03/04/2024 Scorer: Nest Jackson   Recommended Settings IPAP: N/A cmH20 EPAP: N/A cmH2O AHI: N/A AHI (4%): N/A   Pressure IPAP/EPAP 00 05 06 08 09 10 11 12    O2 Vol 0.0 0.0 0.0 0.0 0.0 0.0 0.0 0.0  Time TRT 0.60m 44.54m 10.32m 28.34m 55.12m 29.49m 55.36m 184.36m   TST 0.73m 12.41m 10.60m 24.35m 37.42m 20.3m 54.3m 159.24m  Sleep Stage % Wake 100.0 73.0 4.8 12.5 33.3 29.3 1.8 13.6   % REM 0.0 0.0 0.0 0.0 68.9 24.4 75.0 54.9   % N1 0.0 0.0 0.0 0.0 4.1 0.0 0.0 1.9   % N2 0.0 100.0 100.0 100.0 27.0 73.2 14.8 39.5   % N3 0.0 0.0 0.0 0.0 0.0 2.4 10.2 3.8  Respiratory Total Events 0 1 5 4 7 1 14 5    Obs. Apn. 0 0 4 0 1 0 2 0   Mixed Apn. 0 0 0 0 0 0 0 0   Cen. Apn. 0 0 0 0 0 0 0 0   Hypopneas 0 1 1 4 6 1 12 5    AHI 0.00 5.00 30.00 9.80 11.35 2.93 15.56 1.88   Supine AHI 0.00 0.00 0.00 0.00 15.48 12.00 0.00 1.36   Prone AHI 0.00 0.00 0.00 0.00 0.00 0.00 0.00 0.00   Side AHI 0.00 5.00 30.00 9.80 8.37 0.00 15.56 2.08   Respiratory (4%) Hypopneas (4%) 0.00 1.00 1.00 4.00 6.00 1.00 12.00 5.00   AHI (4%) 0.00 5.00 30.00 9.80 11.35 2.93 15.56 1.88   Supine AHI (4%) 0.00 0.00 0.00 0.00 15.48 12.00 0.00 1.36   Prone AHI (4%) 0.00 0.00 0.00 0.00 0.00 0.00 0.00 0.00   Side AHI (4%) 0.00 5.00 30.00 9.80 8.37 0.00 15.56 2.08  Desat Profile <= 90% 0.51m 0.57m 0.7m 0.68m 0.9m 0.31m 3.74m 1.54m   <= 80% 0.60m 0.65m 0.49m 0.61m 0.66m 0.60m 0.28m 0.74m   <= 70% 0.52m 0.48m 0.22m 0.75m 0.52m  0.43m 0.43m 0.69m   <= 60% 0.43m 0.31m 0.102m 0.52m 0.11m 0.20m 0.2m 0.79m  Arousal Index Apnea 0.0 0.0 6.0 0.0 1.6 0.0 1.1 0.0   Hypopnea 0.0 0.0 6.0 0.0 8.1 0.0 6.7 0.0   LM 0.0 0.0 0.0 2.4 0.0 0.0 0.0 0.0   Spontaneous 0.0 20.0 30.0 14.7 16.2 11.7 1.1 4.9   Pressure IPAP/EPAP 13   O2 Vol 0.0  Time TRT 33.92m   TST 31.67m  Sleep Stage % Wake 7.5   % REM 98.4   % N1 0.0   % N2 1.6   % N3 0.0  Respiratory Total Events 0   Obs. Apn. 0   Mixed Apn. 0   Cen. Apn. 0   Hypopneas 0   AHI 0.00   Supine AHI 0.00   Prone AHI 0.00   Side AHI 0.00  Respiratory (4%) Hypopneas (4%) 0.00   AHI (4%) 0.00   Supine AHI (4%) 0.00   Prone AHI (4%) 0.00   Side AHI (4%) 0.00  Desat Profile <= 90% 0.38m   <= 80% 0.42m   <= 70% 0.81m   <= 60% 0.70m  Arousal Index Apnea 0.0   Hypopnea 0.0   LM 0.0   Spontaneous 3.9  [facility] CPAP Summary    General Information  Name: Jillian Ford, Jillian Ford BMI: 34.72 Physician: DEDRA GORES, MD  ID: [id number] Height: 63.0 in Technician: Delon Sprung, RPSGT  Sex: Female Weight: 196.0 lb Record: [id number]  Age: 23 [12-10-1970] Date: 03/04/2024     Medical & Medication History    Jillian Ford is a 53 y.o. female patient who is seen upon referral on 12/18/2023 from Dr Alphonsa for a TOC of CPAP in OSA.. What brought the sleep study on was an all- girls-trip during which she shared a room with GF and they all witnessed my snoring and apnea. Epworth SS was 15, she was titrated to 8 cm water CPAP  in a second in- lab study. She has gotten only one machine in 10 years but now reports aerophagia. Sleep relevant medical history: Nocturia 0-2, allergic rhinitis , Tonsillectomy, cervical spine DDD, HTN, no DM, Family medical /sleep history: PGM and father were likely affected by OSA father died at 65 of stroke, PGM died at 85, aneurysm. No other family member on CPAP with OSA, insomnia, sleep walkers. Sleep habits are as follows: The patient's dinner time is between 6-6.30 PM. The patient goes to sleep by 9 watching TV , changes to bed at 10 PM and continues to sleep for 6-7 hours, wakes for 1-2 bathroom breaks, the first time at 2 AM. The bedroom is dark and quiet and cool. A sound machine is used to drawn out her snoring . The preferred sleep position is side ways, with the support of 1 pillow, flat bed, no GERD. Dreams are reportedly frequent/vivid. The patient wakes up with an alarm at 6. 30, several snoozes- than up at 6.45 AM is the usual rise time. He/ She reports not feeling refreshed or restored in AM, with symptoms such as dry mouth, morning headaches, and dizziness when rising , having morning residual fatigue. Past medical history: Anxiety, Back pain, Depression, Edema, HTN, IBS, Palpatations, Sleep apnea, SOB. HST: 01/08/24 pAHI: 84.4/h REM pAHI: 82.5/h NREM pAHI: 84.7/h  Astelin , Drospirenine, Lexapro , Hyzaar, Antivert , Valtrex .   Sleep Disorder   OSA   Comments   Patient is a 53 y/o female who was referred to the sleep lab for  a CPAP titration. Patient wore a Cyt[employer]ist Medium FFM. Patient tolerated mask well. Patient started out on 5cm h2o and ended on 13 h2o. Patient had noted hypopneas and OSA. Snoring was loud. No PLMs noted. Patient slept on her left and right side and supine. EKG appeared to be NSR. Patient tolerated therapy well and had no complaints.     CPAP start time: 09:45:32 PM CPAP end time: 05:06:18 AM   Time Total Supine Side Prone Upright  Recording (TRT) 7h  20.69m 1h 13.39m 6h 7.23m 0h 0.41m 0h 0.64m  Sleep (TST) 5h 48.28m 1h 4.49m 4h 44.34m 0h 0.43m 0h 0.19m   Latency N1 N2 N3 REM Onset Per. Slp. Eff.  Actual 1h 25.29m 0h 0.79m 2h 16.66m 1h 3.68m 0h 30.13m 0h 42.40m 79.11%   Stg Dur Wake N1 N2 N3 REM  Total 92.0 4.5 143.0 12.0 189.0  Supine 9.0 1.0 4.5 0.0 59.0  Side 83.0 3.5 138.5 12.0 130.0  Prone 0.0 0.0 0.0 0.0 0.0  Upright 0.0 0.0 0.0 0.0 0.0   Stg % Wake N1 N2 N3 REM  Total 20.9 1.3 41.0 3.4 54.2  Supine 2.0 0.3 1.3 0.0 16.9  Side 18.8 1.0 39.7 3.4 37.3  Prone 0.0 0.0 0.0 0.0 0.0  Upright 0.0 0.0 0.0 0.0 0.0     Apnea Summary Sub Supine Side Prone Upright  Total 7 Total 7 0 7 0 0    REM 2 0 2 0 0    NREM 5 0 5 0 0  Obs 7 REM 2 0 2 0 0    NREM 5 0 5 0 0  Mix 0 REM 0 0 0 0 0    NREM 0 0 0 0 0  Cen 0 REM 0 0 0 0 0    NREM 0 0 0 0 0   Rera Summary Sub Supine Side Prone Upright  Total 0 Total 0 0 0 0 0    REM 0 0 0 0 0    NREM 0 0 0 0 0   Hypopnea Summary Sub Supine Side Prone Upright  Total 30 Total 30 6 24  0 0    REM 18 5 13  0 0    NREM 12 1 11  0 0   4% Hypopnea Summary Sub Supine Side Prone Upright  Total (4%) 30 Total 30 6 24  0 0    REM 18 5 13  0 0    NREM 12 1 11  0 0     AHI Total Obs Mix Cen  6.37 Apnea 1.21 1.21 0.00 0.00   Hypopnea 5.16 -- -- --  6.37 Hypopnea (4%) 5.16 -- -- --    Total Supine Side Prone Upright  Position AHI 6.37 5.58 6.55 0.00 0.00  REM AHI 6.35   NREM AHI 6.39   Position RDI 6.37 5.58 6.55 0.00 0.00  REM RDI 6.35   NREM RDI 6.39    4% Hypopnea Total Supine Side Prone Upright  Position AHI (4%) 6.37 5.58 6.55 0.00 0.00  REM AHI (4%) 6.35   NREM AHI (4%) 6.39   Position RDI (4%) 6.37 5.58 6.55 0.00 0.00  REM RDI (4%) 6.35   NREM RDI (4%) 6.39    Desaturation Information  <100% <90% <80% <70% <60% <50% <40%  Supine 8 0 0 0 0 0 0  Side 43 12 0 0 0 0 0  Prone 0 0 0 0 0 0 0  Upright 0 0 0 0 0 0  0  Total 51 12 0 0 0 0 0  Desaturation threshold setting: 4% Minimum desaturation setting: 10  seconds SaO2 nadir: 52% The longest event was a 63 sec obstructive Hypopnea with a minimum SaO2 of 91%. The lowest SaO2 was 86% associated with a 41 sec obstructive Hypopnea. EKG Rates EKG Avg Max Min  Awake 88 107 67  Asleep 83 98 62  EKG Events:  Awakening/Arousal Information # of Awakenings 21  Wake after sleep onset 61.56m  Wake after persistent sleep 59.63m   Arousal Assoc. Arousals Index  Apneas 3 0.5  Hypopneas 12 2.1  Leg Movements 1 0.2  Snore 0.0 0.0  PTT Arousals 0 0.0  Spontaneous 45 7.7  Total 61 10.5  Myoclonus Information PLMS LMs Index  Total LMs during PLMS 0 0.0  LMs w/ Microarousals 0 0.0   LM LMs Index  w/ Microarousal 1 0.2  w/ Awakening 0 0.0  w/ Resp Event 0 0.0  Spontaneous 0 0.0  Total 1 0.2

## 2024-03-18 ENCOUNTER — Encounter: Payer: Self-pay | Admitting: Physician Assistant

## 2024-03-19 ENCOUNTER — Encounter: Payer: Self-pay | Admitting: Physician Assistant

## 2024-03-19 ENCOUNTER — Ambulatory Visit: Admitting: Physician Assistant

## 2024-03-19 VITALS — BP 122/85 | HR 105 | Temp 97.5°F | Wt 199.6 lb

## 2024-03-19 DIAGNOSIS — Z6834 Body mass index (BMI) 34.0-34.9, adult: Secondary | ICD-10-CM | POA: Diagnosis not present

## 2024-03-19 DIAGNOSIS — G4733 Obstructive sleep apnea (adult) (pediatric): Secondary | ICD-10-CM

## 2024-03-19 NOTE — Assessment & Plan Note (Signed)
 Diagnosed with obstructive sleep apnea causing fatigue and low energy. CPAP therapy expected to improve symptoms. - Ensure delivery and initiation of CPAP therapy.

## 2024-03-19 NOTE — Progress Notes (Signed)
 Established Patient Office Visit  Subjective   Patient ID: Jillian Ford, female    DOB: 1970-12-15  Age: 53 y.o. MRN: [id number]  Chief Complaint  Patient presents with   Acute Visit    Want to talk about medication for sleep study    Discussed the use of AI scribe software for clinical note transcription with the patient, who gave verbal consent to proceed.  History of Present Illness Jillian Ford is a 53 year old female with sleep apnea who presents for follow-up regarding CPAP therapy and weight management.  She experiences extreme fatigue and lacks energy, which she hopes will improve with CPAP therapy. She is awaiting the delivery of her CPAP machine. Her lack of energy affects her ability to exercise and perform daily activities. She is interested in exploring weight loss medications that might be covered by her insurance. Her current medications include Hyaar, hormone replacement therapy, and Lexapro . She cannot take Wellbutrin  due to a previous adverse reaction.   Review of Systems  Constitutional:  Positive for malaise/fatigue. Negative for chills and fever.  Eyes:  Negative for blurred vision and double vision.  Respiratory:  Negative for cough and shortness of breath.   Cardiovascular:  Negative for chest pain and palpitations.  Neurological:  Negative for dizziness and headaches.      Objective:     BP 122/85   Pulse (!) 105   Temp (!) 97.5 F (36.4 C)   Wt 199 lb 9.6 oz (90.5 kg)   LMP 02/10/2024 (Approximate)   SpO2 96%   BMI 34.26 kg/m    Physical Exam Constitutional:      Appearance: Normal appearance. She is obese.  HENT:     Head: Normocephalic and atraumatic.     Mouth/Throat:     Mouth: Mucous membranes are moist.     Pharynx: Oropharynx is clear.  Eyes:     Extraocular Movements: Extraocular movements intact.     Conjunctiva/sclera: Conjunctivae normal.  Cardiovascular:     Rate and Rhythm: Normal rate and regular rhythm.     Heart  sounds: Normal heart sounds. No murmur heard. Pulmonary:     Effort: Pulmonary effort is normal.     Breath sounds: Normal breath sounds.  Musculoskeletal:     Right lower leg: No edema.     Left lower leg: No edema.  Skin:    General: Skin is warm and dry.  Neurological:     General: No focal deficit present.     Mental Status: She is alert and oriented to person, place, and time.  Psychiatric:        Mood and Affect: Mood normal.        Behavior: Behavior normal.      No results found for any visits on 03/19/24.  The ASCVD Risk score (Arnett DK, et al., 2019) failed to calculate for the following reasons:   The valid total cholesterol range is 130 to 320 mg/dL    Assessment & Plan:   Return in about 4 months (around 07/19/2024) for weight loss .   Severe obstructive sleep apnea-hypopnea syndrome Assessment & Plan: Diagnosed with obstructive sleep apnea causing fatigue and low energy. CPAP therapy expected to improve symptoms. - Ensure delivery and initiation of CPAP therapy.   BMI 34.0-34.9,adult Assessment & Plan: Obesity with low energy for exercise. Zepbound  approved for sleep apnea but not covered by her insurance. Discussed benefits and high cost. Limited options due to medical history and current  medications. - Refer to the healthy weight and wellness team for resources on diet and exercise. - Consider future use of Zepbound  if insurance coverage changes. - Explored other weight loss medications that may be covered by insurance, however no other alternatives are covered at this time.  - Follow up first of the year to discuss weight loss and potential changes in insurance coverages.   Orders: -     Amb Ref to Medical Weight Management    Charmaine Calvary Difranco, PA-C

## 2024-03-19 NOTE — Assessment & Plan Note (Signed)
 Obesity with low energy for exercise. Zepbound  approved for sleep apnea but not covered by her insurance. Discussed benefits and high cost. Limited options due to medical history and current medications. - Refer to the healthy weight and wellness team for resources on diet and exercise. - Consider future use of Zepbound  if insurance coverage changes. - Explored other weight loss medications that may be covered by insurance, however no other alternatives are covered at this time.  - Follow up first of the year to discuss weight loss and potential changes in insurance coverages.

## 2024-03-19 NOTE — Telephone Encounter (Signed)
 Nurses Based on what patient states it would be reasonable Please send this MyChart message to Wataga Grooms PA who saw the patient today She will be able to handle this

## 2024-03-20 ENCOUNTER — Other Ambulatory Visit: Payer: Self-pay | Admitting: Physician Assistant

## 2024-03-20 ENCOUNTER — Ambulatory Visit
Admission: EM | Admit: 2024-03-20 | Discharge: 2024-03-20 | Disposition: A | Discharge status: Home / Self Care | Attending: Family Medicine | Admitting: Family Medicine

## 2024-03-20 ENCOUNTER — Encounter: Payer: Self-pay | Admitting: Emergency Medicine

## 2024-03-20 DIAGNOSIS — J069 Acute upper respiratory infection, unspecified: Secondary | ICD-10-CM

## 2024-03-20 DIAGNOSIS — R030 Elevated blood-pressure reading, without diagnosis of hypertension: Secondary | ICD-10-CM

## 2024-03-20 DIAGNOSIS — G4733 Obstructive sleep apnea (adult) (pediatric): Secondary | ICD-10-CM

## 2024-03-20 LAB — POC SOFIA SARS ANTIGEN FIA: SARS Coronavirus 2 Ag: NEGATIVE

## 2024-03-20 MED ORDER — TIRZEPATIDE-WEIGHT MANAGEMENT 2.5 MG/0.5ML ~~LOC~~ SOLN: 2.5000 mg | SUBCUTANEOUS | 0 refills | Status: DC

## 2024-03-20 MED ORDER — PROMETHAZINE-DM 6.25-15 MG/5ML PO SYRP: 5.0000 mL | ORAL_SOLUTION | Freq: Four times a day (QID) | ORAL | 0 refills | Status: DC | PRN

## 2024-03-20 MED ORDER — TIRZEPATIDE-WEIGHT MANAGEMENT 7.5 MG/0.5ML ~~LOC~~ SOLN: 7.5000 mg | SUBCUTANEOUS | 0 refills | Status: DC

## 2024-03-20 MED ORDER — TIRZEPATIDE-WEIGHT MANAGEMENT 5 MG/0.5ML ~~LOC~~ SOLN: 5.0000 mg | SUBCUTANEOUS | 0 refills | Status: DC

## 2024-03-20 NOTE — Discharge Instructions (Signed)
 In addition to the cough syrup prescribed, you may take the Astelin  nasal spray twice daily, Coricidin HBP, plain Mucinex, Tylenol as needed, and drink plenty of fluids and get lots of rest.  Follow-up for significantly worsening symptoms.

## 2024-03-20 NOTE — ED Triage Notes (Signed)
 Cough x 3 days.  Nasal congestion, right ear pain and scratchy throat.

## 2024-03-21 NOTE — ED Provider Notes (Signed)
 [facility]    CSN: [id number] Arrival date & time: 03/20/24  0802      History   Chief Complaint No chief complaint on file.   HPI Jillian Ford is a 53 y.o. female.   Patient presenting today with 3-day history of cough, congestion, right ear pain, scratchy throat.  Denies fever, chest pain, shortness of breath, abdominal pain, vomiting, diarrhea.  So far taking over-the-counter remedies with minimal relief.  No known history of chronic pulmonary disease.    Past Medical History:  Diagnosis Date   Anxiety    Back pain    Depression    Edema of both lower extremities    GERD (gastroesophageal reflux disease)    Hypertension    IBS (irritable bowel syndrome)    Palpitations    Sleep apnea    SOB (shortness of breath)     Patient Active Problem List   Diagnosis Date Noted   BMI 34.0-34.9,adult 03/19/2024   Severe obstructive sleep apnea-hypopnea syndrome 03/11/2024   Encounter for surveillance of contraceptive pills 03/09/2024   Intolerance of continuous positive airway pressure (CPAP) ventilation 01/22/2024   Aerophagia 01/22/2024   Hypersomnia with sleep apnea 01/22/2024   OAB (overactive bladder) 10/08/2023   Urge incontinence of urine 10/08/2023   Urinary urgency 10/08/2023   Mixed stress and urge urinary incontinence 10/08/2023   SUI (stress urinary incontinence, female) 09/09/2023   Decreased libido 09/09/2023   Seasonal allergies 11/08/2022   Irregular periods 09/07/2022   Hot flashes 09/07/2022   Generalized obesity with starting BMI 31.5 08/28/2022   Polyphagia 05/16/2022   Perimenopausal 04/12/2022   Amenorrhea 03/14/2022   Depression 03/14/2022   Elevated ALT measurement 03/14/2022   Vitamin D  deficiency 03/14/2022   Essential hypertension 03/14/2022   Depression with anxiety 03/18/2020   Gastroesophageal reflux disease without esophagitis 12/08/2016   Irritable bowel syndrome with both constipation and diarrhea 12/08/2016   Ganglion  cyst of right foot 12/08/2016   Anxiety 08/29/2015   Insomnia 08/29/2015    History reviewed. No pertinent surgical history.  OB History     Gravida  2   Para  2   Term  2   Preterm      AB      Living  2      SAB      IAB      Ectopic      Multiple      Live Births  2            Home Medications    Prior to Admission medications   Medication Sig Start Date End Date Taking? Authorizing Provider  promethazine -dextromethorphan (PROMETHAZINE -DM) 6.25-15 MG/5ML syrup Take 5 mLs by mouth 4 (four) times daily as needed. 03/20/24  Yes Stuart Vernell Norris, PA-C  acetaminophen (TYLENOL) 325 MG tablet Take 650 mg by mouth every 6 (six) hours as needed.    [provider]  azelastine  (ASTELIN ) 0.1 % nasal spray Place 2 sprays into both nostrils 2 (two) times daily. Patient taking differently: Place 2 sprays into both nostrils as needed. 11/05/23   Alphonsa Glendia LABOR, MD  calcium carbonate (TUMS - DOSED IN MG ELEMENTAL CALCIUM) 500 MG chewable tablet Chew 1 tablet by mouth daily.    [provider]  Drospirenone-Estetrol (NEXTSTELLIS ) 3-14.2 MG TABS Take 1 tablet by mouth daily. 03/09/24   Signa Delon LABOR, NP  escitalopram  (LEXAPRO ) 20 MG tablet Take 1 tablet (20 mg total) by mouth at bedtime. 11/05/23  Alphonsa Glendia LABOR, MD  ibuprofen  (ADVIL ) 200 MG tablet Take 400 mg by mouth as needed.    [provider]  losartan -hydrochlorothiazide (HYZAAR) 50-12.5 MG tablet Take 1 tablet by mouth daily. 11/05/23   Alphonsa Glendia LABOR, MD  meclizine  (ANTIVERT ) 12.5 MG tablet Take 1 tablet (12.5 mg total) by mouth 3 (three) times daily as needed for dizziness. Do not take with alcohol or while driving or operating heavy machinery.  May cause drowsiness. 10/02/22   Chandra Harlene LABOR, NP  tirzepatide  (ZEPBOUND ) 2.5 MG/0.5ML injection vial Inject 2.5 mg into the skin once a week. 03/20/24   Grooms, Courtney, PA-C  tirzepatide  5 MG/0.5ML injection vial Inject 5 mg into the  skin once a week. 03/20/24   Grooms, Courtney, PA-C  tirzepatide  7.5 MG/0.5ML injection vial Inject 7.5 mg into the skin once a week. 03/20/24   Grooms, Courtney, PA-C  triamcinolone  cream (KENALOG ) 0.1 % Apply 1 Application topically 2 (two) times daily. 03/03/24   Alphonsa Glendia LABOR, MD  valACYclovir  (VALTREX ) 1000 MG tablet TAKE 2 TABLETS BY MOUTH AT THE FIRST SIGN OF COLD SORE AND REPEAT IN 12 HOURSUSE AS NEEDED 10/11/23   Alphonsa Glendia LABOR, MD    Family History Family History  Problem Relation Age of Onset   Hypertension Mother    Other Mother        brain injury   Alcohol abuse Father    Stroke Father    Early death Father    Hypertension Father    Sleep apnea Father    Dementia Maternal Grandmother    Heart attack Paternal Grandmother     Social History Social History   Tobacco Use   Smoking status: Never   Smokeless tobacco: Never  Vaping Use   Vaping status: Never Used  Substance Use Topics   Alcohol use: Yes    Comment: social   Drug use: Never     Allergies   Wellbutrin  [bupropion ]   Review of Systems Review of Systems Per HPI  Physical Exam Triage Vital Signs ED Triage Vitals  Encounter Vitals Group     BP 03/20/24 0809 (!) 145/88     Girls Systolic BP Percentile --      Girls Diastolic BP Percentile --      Boys Systolic BP Percentile --      Boys Diastolic BP Percentile --      Pulse Rate 03/20/24 0809 (!) 109     Resp 03/20/24 0809 18     Temp 03/20/24 0809 98.8 F (37.1 C)     Temp Source 03/20/24 0809 Oral     SpO2 03/20/24 0809 95 %     Weight --      Height --      Head Circumference --      Peak Flow --      Pain Score 03/20/24 0810 5     Pain Loc --      Pain Education --      Exclude from Growth Chart --    No data found.  Updated Vital Signs BP (!) 145/88 (BP Location: Right Arm)   Pulse (!) 109   Temp 98.8 F (37.1 C) (Oral)   Resp 18   LMP 03/14/2024 (Exact Date)   SpO2 95%   Visual Acuity Right Eye Distance:   Left Eye  Distance:   Bilateral Distance:    Right Eye Near:   Left Eye Near:    Bilateral Near:     Physical  Exam Vitals and nursing note reviewed.  Constitutional:      Appearance: Normal appearance.  HENT:     Head: Atraumatic.     Right Ear: Tympanic membrane and external ear normal.     Left Ear: Tympanic membrane and external ear normal.     Nose: Rhinorrhea present.     Mouth/Throat:     Mouth: Mucous membranes are moist.     Pharynx: Posterior oropharyngeal erythema present.  Eyes:     Extraocular Movements: Extraocular movements intact.     Conjunctiva/sclera: Conjunctivae normal.  Cardiovascular:     Rate and Rhythm: Normal rate and regular rhythm.     Heart sounds: Normal heart sounds.  Pulmonary:     Effort: Pulmonary effort is normal.     Breath sounds: Normal breath sounds. No wheezing or rales.  Musculoskeletal:        General: Normal range of motion.     Cervical back: Normal range of motion and neck supple.  Skin:    General: Skin is warm and dry.  Neurological:     Mental Status: She is alert and oriented to person, place, and time.  Psychiatric:        Mood and Affect: Mood normal.        Thought Content: Thought content normal.      UC Treatments / Results  Labs (all labs ordered are listed, but only abnormal results are displayed) Labs Reviewed  POC SOFIA SARS ANTIGEN FIA    EKG   Radiology No results found.  Procedures Procedures (including critical care time)  Medications Ordered in UC Medications - No data to display  Initial Impression / Assessment and Plan / UC Course  I have reviewed the triage vital signs and the nursing notes.  Pertinent labs & imaging results that were available during my care of the patient were reviewed by me and considered in my medical decision making (see chart for details).     Hypertensive and mildly tachycardic in triage, otherwise vital signs reassuring.  Rapid COVID-negative, suspect viral respiratory  infection be causing symptoms.  Will treat with Phenergan  DM, and discussed supportive over-the-counter medications and home care that will not further elevate blood pressure to include Coricidin HBP, plain Mucinex, Flonase , Tylenol.  Supportive home care and return precautions reviewed.  Final Clinical Impressions(s) / UC Diagnoses   Final diagnoses:  Viral URI with cough  Elevated blood pressure reading     Discharge Instructions      In addition to the cough syrup prescribed, you may take the Astelin  nasal spray twice daily, Coricidin HBP, plain Mucinex, Tylenol as needed, and drink plenty of fluids and get lots of rest.  Follow-up for significantly worsening symptoms.    ED Prescriptions     Medication Sig Dispense Auth. Provider   promethazine -dextromethorphan (PROMETHAZINE -DM) 6.25-15 MG/5ML syrup Take 5 mLs by mouth 4 (four) times daily as needed. 100 mL Stuart Vernell Norris, [place]      PDMP not reviewed this encounter.   Stuart Vernell Norris, [place] 03/21/24 1451

## 2024-03-25 ENCOUNTER — Other Ambulatory Visit (HOSPITAL_COMMUNITY): Payer: Self-pay

## 2024-03-25 ENCOUNTER — Telehealth: Payer: Self-pay | Admitting: Pharmacy Technician

## 2024-03-25 NOTE — Telephone Encounter (Signed)
 PA request has been Started. New Encounter has been or will be created for follow up. For additional info see Pharmacy Prior Auth telephone encounter from 03/25/2024.

## 2024-03-25 NOTE — Telephone Encounter (Signed)
 Pharmacy Patient Advocate Encounter   Received notification from RX Request Messages that prior authorization for Zepbound  2.5MG /0.5ML pen-injectors is required/requested.   Insurance verification completed.   The patient is insured through [facility] .   Per test claim: PA required; PA submitted to above mentioned insurance via Latent Key/confirmation #/EOC [id number] Status is pending

## 2024-03-26 NOTE — Telephone Encounter (Signed)
 Pharmacy Patient Advocate Encounter  Received notification from [employer] that Prior Authorization for Zepbound  2.5MG /0.5ML pen-injectors  has been DENIED.  Full denial letter will be uploaded to the media tab. See denial reason below.   PA #/Case ID/Reference #: [id number]

## 2024-04-02 ENCOUNTER — Encounter: Payer: Self-pay | Admitting: Neurology

## 2024-04-03 ENCOUNTER — Telehealth: Payer: Self-pay | Admitting: Family Medicine

## 2024-04-03 NOTE — Telephone Encounter (Signed)
 Hi Haleigh  #1 I read through all of the messages including the denial letter the patient received when we tried to prescribe Zepbound   #2 I appreciate the efforts that sleep lab has done for this patient  #3-when I looked at the denial letter it states that one of the criteria is that if the patient can continues to have sleep apnea symptoms despite documented compliance with the sleep apnea machine then it may be covered  Certainly-I agree with your specialist-the prescription and the appeal should come from us  since we would be the ones monitoring the medicine-but there are some nuances to this that go beyond my role as a primary care doctor  So my questions #1 how long should the patient utilize the machine for her sleep apnea before from a medical perspective 1 could declare that she is still having symptoms despite adequate usage?  In other words 2 weeks of use, 1 month of use follow-up 2 months, etc.?  #2-would your office be able to do a compliance check so that we can show that the patient has been utilizing her CPAP 4 hours or more per night during that recommended period of time?  #3 then could we have something written stating that the patient was compliant for adequate length of time as based on the digital analysis of her CPAP use?  Then I believe-our office could do a follow-up office visit with her to review her symptoms, including additional information from your specialists, then file the appeal of the denial-unfortunately I am somewhat pessimistic that due to the high cost of medication the vast majority of insurance companies make it nearly impossible to get this medicine approved-but I feel that I owe the patient a reasonable attempt at getting it approved  The above information-although cumbersome and perhaps aggravating to gather on your part would be helpful at appeal to some sort of resolution  I appreciate your willingness to review this with your specialist then give  me some feedback regarding these points-no immediate rush you could let me know next week-thank you so much-[facility]

## 2024-04-06 NOTE — Telephone Encounter (Signed)
 3 months of use for mild - moderate apnea,  It can take that long to built the response and resolution of sleepiness.

## 2024-04-06 NOTE — Telephone Encounter (Signed)
 Dr. Chalice,  Would you be willing to answer the following questions: #1 how long should the patient utilize the machine for her sleep apnea before from a medical perspective 1 could declare that she is still having symptoms despite adequate usage?  In other words 2 weeks of use, 1 month of use follow-up 2 months, etc.?   #2-would your office be able to do a compliance check so that we can show that the patient has been utilizing her CPAP 4 hours or more per night during that recommended period of time?     #3 then could we have something written stating that the patient was compliant for adequate length of time as based on the digital analysis of her CPAP use?   LET ME KNOW YOUR RECOMMENDATIONS ON HOW TO PROCEED.

## 2024-04-08 ENCOUNTER — Telehealth: Payer: Self-pay | Admitting: Pharmacy Technician

## 2024-04-08 ENCOUNTER — Other Ambulatory Visit (HOSPITAL_COMMUNITY): Payer: Self-pay

## 2024-04-08 NOTE — Telephone Encounter (Signed)
 Pharmacy Patient Advocate Encounter   Received notification from Cover[employer]Meds that prior authorization for Zepbound  2.5MG /0.5ML solution is required/requested.   Insurance verification completed.   The patient is insured through [facility] .   Per test claim: The current 28 day co-pay is, $24.99.  No PA needed at this time. This test claim was processed through [facility]- copay amounts may vary at other pharmacies due to pharmacy/plan contracts, or as the patient moves through the different stages of their insurance plan.    See appeal approval letter sent to patient. Zepbound  approved 04/05/2024-10/03/2024.

## 2024-04-24 ENCOUNTER — Ambulatory Visit
Admission: EM | Admit: 2024-04-24 | Discharge: 2024-04-24 | Disposition: A | Discharge status: Home / Self Care | Attending: Nurse Practitioner | Admitting: Nurse Practitioner

## 2024-04-24 ENCOUNTER — Other Ambulatory Visit: Payer: Self-pay

## 2024-04-24 ENCOUNTER — Encounter: Payer: Self-pay | Admitting: Emergency Medicine

## 2024-04-24 DIAGNOSIS — K5900 Constipation, unspecified: Secondary | ICD-10-CM

## 2024-04-24 MED ORDER — POLYETHYLENE GLYCOL 3350 17 G PO PACK: PACK | ORAL | 0 refills | Status: AC

## 2024-04-24 MED ORDER — SENNOSIDES-DOCUSATE SODIUM 8.6-50 MG PO TABS
2.0000 | ORAL_TABLET | Freq: Every evening | ORAL | 0 refills | Status: AC | PRN
Start: 2024-04-24 — End: 2024-05-24

## 2024-04-24 NOTE — ED Provider Notes (Signed)
 [facility]    CSN: [id number] Arrival date & time: 04/24/24  0802      History   Chief Complaint No chief complaint on file.   HPI Jillian Ford is a 53 y.o. female.   The history is provided by the patient.   Patient presents for complaints of constipation.  Patient states her last bowel movement was approximately 6 days ago.  She complains of right lower quadrant abdominal pain, belching, and bloating.  She denies fever, chills, nausea, vomiting, diarrhea, low back pain, bloody stools, or flatulence.  So far, states she has been taking several over-the-counter medications to include MiraLAX  and Colace.  Reports prior episode of constipation in the past, but nothing recent.  States that she is working on increase in the amount of water and fruits and vegetables in her diet.  She denies history of gastrointestinal disease.  Past Medical History:  Diagnosis Date   Anxiety    Back pain    Depression    Edema of both lower extremities    GERD (gastroesophageal reflux disease)    Hypertension    IBS (irritable bowel syndrome)    Palpitations    Sleep apnea    SOB (shortness of breath)     Patient Active Problem List   Diagnosis Date Noted   BMI 34.0-34.9,adult 03/19/2024   Severe obstructive sleep apnea-hypopnea syndrome 03/11/2024   Encounter for surveillance of contraceptive pills 03/09/2024   Intolerance of continuous positive airway pressure (CPAP) ventilation 01/22/2024   Aerophagia 01/22/2024   Hypersomnia with sleep apnea 01/22/2024   OAB (overactive bladder) 10/08/2023   Urge incontinence of urine 10/08/2023   Urinary urgency 10/08/2023   Mixed stress and urge urinary incontinence 10/08/2023   SUI (stress urinary incontinence, female) 09/09/2023   Decreased libido 09/09/2023   Seasonal allergies 11/08/2022   Irregular periods 09/07/2022   Hot flashes 09/07/2022   Generalized obesity with starting BMI 31.5 08/28/2022   Polyphagia 05/16/2022    Perimenopausal 04/12/2022   Amenorrhea 03/14/2022   Depression 03/14/2022   Elevated ALT measurement 03/14/2022   Vitamin D  deficiency 03/14/2022   Essential hypertension 03/14/2022   Depression with anxiety 03/18/2020   Gastroesophageal reflux disease without esophagitis 12/08/2016   Irritable bowel syndrome with both constipation and diarrhea 12/08/2016   Ganglion cyst of right foot 12/08/2016   Anxiety 08/29/2015   Insomnia 08/29/2015    History reviewed. No pertinent surgical history.  OB History     Gravida  2   Para  2   Term  2   Preterm      AB      Living  2      SAB      IAB      Ectopic      Multiple      Live Births  2            Home Medications    Prior to Admission medications   Medication Sig Start Date End Date Taking? Authorizing Provider  acetaminophen (TYLENOL) 325 MG tablet Take 650 mg by mouth every 6 (six) hours as needed.    [provider]  azelastine  (ASTELIN ) 0.1 % nasal spray Place 2 sprays into both nostrils 2 (two) times daily. Patient taking differently: Place 2 sprays into both nostrils as needed. 11/05/23   Alphonsa Glendia LABOR, MD  calcium carbonate (TUMS - DOSED IN MG ELEMENTAL CALCIUM) 500 MG chewable tablet Chew 1 tablet by mouth daily.  [provider]  Drospirenone-Estetrol (NEXTSTELLIS ) 3-14.2 MG TABS Take 1 tablet by mouth daily. 03/09/24   Signa Delon LABOR, NP  escitalopram  (LEXAPRO ) 20 MG tablet Take 1 tablet (20 mg total) by mouth at bedtime. 11/05/23   Alphonsa Glendia LABOR, MD  ibuprofen  (ADVIL ) 200 MG tablet Take 400 mg by mouth as needed.    [provider]  losartan -hydrochlorothiazide (HYZAAR) 50-12.5 MG tablet Take 1 tablet by mouth daily. 11/05/23   Alphonsa Glendia LABOR, MD  meclizine  (ANTIVERT ) 12.5 MG tablet Take 1 tablet (12.5 mg total) by mouth 3 (three) times daily as needed for dizziness. Do not take with alcohol or while driving or operating heavy machinery.  May cause drowsiness.  10/02/22   Chandra Harlene LABOR, NP  promethazine -dextromethorphan (PROMETHAZINE -DM) 6.25-15 MG/5ML syrup Take 5 mLs by mouth 4 (four) times daily as needed. 03/20/24   Stuart Vernell Norris, PA-C  tirzepatide  (ZEPBOUND ) 2.5 MG/0.5ML injection vial Inject 2.5 mg into the skin once a week. 03/20/24   Grooms, Courtney, PA-C  tirzepatide  5 MG/0.5ML injection vial Inject 5 mg into the skin once a week. 03/20/24   Grooms, Courtney, PA-C  tirzepatide  7.5 MG/0.5ML injection vial Inject 7.5 mg into the skin once a week. 03/20/24   Grooms, Courtney, PA-C  triamcinolone  cream (KENALOG ) 0.1 % Apply 1 Application topically 2 (two) times daily. 03/03/24   Alphonsa Glendia LABOR, MD  valACYclovir  (VALTREX ) 1000 MG tablet TAKE 2 TABLETS BY MOUTH AT THE FIRST SIGN OF COLD SORE AND REPEAT IN 12 HOURSUSE AS NEEDED 10/11/23   Alphonsa Glendia LABOR, MD    Family History Family History  Problem Relation Age of Onset   Hypertension Mother    Other Mother        brain injury   Alcohol abuse Father    Stroke Father    Early death Father    Hypertension Father    Sleep apnea Father    Dementia Maternal Grandmother    Heart attack Paternal Grandmother     Social History Social History   Tobacco Use   Smoking status: Never   Smokeless tobacco: Never  Vaping Use   Vaping status: Never Used  Substance Use Topics   Alcohol use: Yes    Comment: social   Drug use: Never     Allergies   Wellbutrin  [bupropion ]   Review of Systems Review of Systems Per HPI  Physical Exam Triage Vital Signs ED Triage Vitals  Encounter Vitals Group     BP 04/24/24 0821 126/84     Girls Systolic BP Percentile --      Girls Diastolic BP Percentile --      Boys Systolic BP Percentile --      Boys Diastolic BP Percentile --      Pulse Rate 04/24/24 0821 94     Resp 04/24/24 0821 18     Temp 04/24/24 0821 98.8 F (37.1 C)     Temp Source 04/24/24 0821 Oral     SpO2 04/24/24 0821 97 %     Weight --      Height --      Head  Circumference --      Peak Flow --      Pain Score 04/24/24 0823 0     Pain Loc --      Pain Education --      Exclude from Growth Chart --    No data found.  Updated Vital Signs BP 126/84 (BP Location: Right Arm)  Pulse 94   Temp 98.8 F (37.1 C) (Oral)   Resp 18   LMP 04/08/2024 (Approximate)   SpO2 97%   Visual Acuity Right Eye Distance:   Left Eye Distance:   Bilateral Distance:    Right Eye Near:   Left Eye Near:    Bilateral Near:     Physical Exam Vitals and nursing note reviewed.  Constitutional:      General: She is not in acute distress.    Appearance: Normal appearance.  HENT:     Head: Normocephalic.  Eyes:     Extraocular Movements: Extraocular movements intact.     Pupils: Pupils are equal, round, and reactive to light.  Cardiovascular:     Rate and Rhythm: Normal rate and regular rhythm.     Pulses: Normal pulses.     Heart sounds: Normal heart sounds.  Pulmonary:     Effort: Pulmonary effort is normal.     Breath sounds: Normal breath sounds.  Abdominal:     General: Bowel sounds are normal. There is no distension.     Palpations: Abdomen is soft. There is no mass.     Tenderness: There is no abdominal tenderness. There is no guarding or rebound.     Hernia: No hernia is present.  Musculoskeletal:     Cervical back: Normal range of motion.  Skin:    General: Skin is warm and dry.  Neurological:     General: No focal deficit present.     Mental Status: She is alert and oriented to person, place, and time.  Psychiatric:        Mood and Affect: Mood normal.        Behavior: Behavior normal.      UC Treatments / Results  Labs (all labs ordered are listed, but only abnormal results are displayed) Labs Reviewed - No data to display  EKG   Radiology No results found.  Procedures Procedures (including critical care time)  Medications Ordered in UC Medications - No data to display  Initial Impression / Assessment and Plan / UC  Course  I have reviewed the triage vital signs and the nursing notes.  Pertinent labs & imaging results that were available during my care of the patient were reviewed by me and considered in my medical decision making (see chart for details).  Patient presents with a 6-day history of constipation.  On exam, lung sounds are clear throughout, she does not exhibit any abdominal tenderness today.  Vital signs are stable.  Do not suspect acute abdomen or possible obstruction at this time.  Will treat with MiraLAX  17 g along with Senokot 8.6/50 mg tablets.  Supportive care recommendations were provided and discussed with the patient to include increasing her fluid intake, dietary modifications, and to remain active.  Patient was given information regarding foods that she can have to help with symptoms.  Patient was given strict ER follow-up precautions.  Patient was in agreement with this plan of care and verbalizes understanding.  All questions were answered.  Patient stable for discharge.  Work note was provided.  Final Clinical Impressions(s) / UC Diagnoses   Final diagnoses:  None   Discharge Instructions   None    ED Prescriptions   None    PDMP not reviewed this encounter.   Gilmer Etta PARAS, NP 04/24/24 [phone]

## 2024-04-24 NOTE — Discharge Instructions (Signed)
 Take medication as prescribed.  As discussed, take the MiraLAX  twice daily until you have a long sausage like stool.  Once you have, you may begin taking the MiraLAX  daily as needed. Take medication as prescribed. Increase fluids.  Try to drink at least 8-10 8 ounce glasses of water daily while symptoms persist. Make sure you are eating a diet that is high in fruits and vegetables.  Increasing the fiber in your diet will help decrease constipation. You may take over-the-counter Tylenol as needed for pain or discomfort. Go to the emergency department if you experience worsening abdominal pain, or develop new symptoms of fever, chills, or do not have a bowel movement within the next 3 to 5 days. Follow-up as needed.

## 2024-04-24 NOTE — ED Triage Notes (Signed)
 Pt reports has not had BM since Saturday. Has tried miralax ,colace, prune juice with no change in symptoms. Pt reports abdominal bloating and intermittent right sided abdominal pain.denies fever, nausea, emesis.

## 2024-04-28 ENCOUNTER — Other Ambulatory Visit: Payer: Self-pay | Admitting: Urology

## 2024-04-30 NOTE — Patient Instructions (Signed)
 SURGICAL WAITING ROOM VISITATION Patients having surgery or a procedure may have no more than 2 support people in the waiting area - these visitors may rotate in the visitor waiting room.   If the patient needs to stay at the hospital during part of their recovery, the visitor guidelines for inpatient rooms apply.  PRE-OP VISITATION  Pre-op nurse will coordinate an appropriate time for 1 support person to accompany the patient in pre-op.  This support person may not rotate.  This visitor will be contacted when the time is appropriate for the visitor to come back in the pre-op area.  Please refer to the [facility] website for the visitor guidelines for Inpatients (after your surgery is over and you are in a regular room).  You are not required to quarantine at this time prior to your surgery. However, you must do this: Hand Hygiene often Do NOT share personal items Notify your provider if you are in close contact with someone who has COVID or you develop fever 100.4 or greater, new onset of sneezing, cough, sore throat, shortness of breath or body aches.  If you test positive for Covid or have been in contact with anyone that has tested positive in the last 10 days please notify you surgeon.    Your procedure is scheduled on:  TUESDAY  May 12, 2024  Report to [facility] Main Entrance: [address] entrance where the [employer] is available.   Report to admitting at: 05:15   AM  Call this number if you have any questions or problems the morning of surgery [phone]  DO NOT EAT OR DRINK ANYTHING AFTER MIDNIGHT THE NIGHT PRIOR TO YOUR SURGERY / PROCEDURE.   FOLLOW  ANY ADDITIONAL PRE OP INSTRUCTIONS YOU RECEIVED FROM YOUR SURGEON'S OFFICE!!!   Oral Hygiene is also important to reduce your risk of infection.        Remember - BRUSH YOUR TEETH THE MORNING OF SURGERY WITH YOUR REGULAR TOOTHPASTE  Do NOT smoke after Midnight the night before surgery.  Tirzepatide  (ZEPBOUND )-  do not inject 7-10 days before surgery.  Last dose will be Friday 05-01-24  STOP TAKING all Vitamins, Herbs and supplements 1 week before your surgery.   Take ONLY these medicines the morning of surgery with A SIP OF WATER: Tylenol if needed for pain and you may use your Astelin  nasal spray.  DO NOT TAKE LOSARTAN -HCTZ the morning of your surgery  If You have been diagnosed with Sleep Apnea - Bring CPAP mask and tubing day of surgery. We will provide you with a CPAP machine on the day of your surgery.                   You may not have any metal on your body including hair pins, jewelry, and body piercing  Do not wear make-up, lotions, powders, perfumes or deodorant  Do not wear nail polish including gel and S&S, artificial / acrylic nails, or any other type of covering on natural nails including finger and toenails. If you have artificial nails, gel coating, etc., that needs to be removed by a nail salon, Please have this removed prior to surgery. Not doing so may mean that your surgery could be cancelled or delayed if the Surgeon or anesthesia staff feels like they are unable to monitor you safely.   Do not shave 48 hours prior to surgery to avoid nicks in your skin which may contribute to postoperative infections.   Contacts, Hearing Aids,  dentures or bridgework may not be worn into surgery. DENTURES WILL BE REMOVED PRIOR TO SURGERY PLEASE DO NOT APPLY Poly grip OR ADHESIVES!!!  Patients discharged on the day of surgery will not be allowed to drive home.  Someone NEEDS to stay with you for the first 24 hours after anesthesia.  Do not bring your home medications to the hospital. The Pharmacy will dispense medications listed on your medication list to you during your admission in the Hospital.  Please read over the following fact sheets you were given: IF YOU HAVE QUESTIONS ABOUT YOUR PRE-OP INSTRUCTIONS, PLEASE CALL [phone].   [place] - Preparing for Surgery      Before  surgery, you can play an important role.  Because skin is not sterile, your skin needs to be as free of germs as possible.  You can reduce the number of germs on your skin by washing with CHG (chlorahexidine gluconate) soap before surgery.  CHG is an antiseptic cleaner which kills germs and bonds with the skin to continue killing germs even after washing. Please DO NOT use if you have an allergy to CHG or antibacterial soaps.  If your skin becomes reddened/irritated stop using the CHG and inform your nurse when you arrive at Short Stay. Do not shave (including legs and underarms) for at least 48 hours prior to the first CHG shower.  You may shave your face/neck.  Please follow these instructions carefully:  1.  Shower with CHG Soap the night before surgery ONLY (DO NOT USE THE CHG SOAP THE MORNING OF SURGERY).  2.  If you choose to wash your hair, wash your hair first as usual with your normal  shampoo.  3.  After you shampoo, rinse your hair and body thoroughly to remove the shampoo.                             4.  Use CHG as you would any other liquid soap.  You can apply chg directly to the skin and wash.  Gently with a scrungie or clean washcloth.  5.  Apply the CHG Soap to your body ONLY FROM THE NECK DOWN.   Do not use on face/ open                           Wound or open sores. Avoid contact with eyes, ears mouth and genitals (private parts).                       Wash face,  Genitals (private parts) with your normal soap.             6.  Wash thoroughly, paying special attention to the area where your  surgery  will be performed.  7.  Thoroughly rinse your body with warm water from the neck down.  8.  DO NOT shower/wash with your normal soap after using and rinsing off the CHG Soap.                9.  Pat yourself dry with a clean towel.            10.  Wear clean pajamas.            11.  Place clean sheets on your bed the night of your first shower and do not  sleep with pets.  Day of  Surgery : Do  not apply any CHG, lotions/deodorants the morning of surgery.  Please wear clean clothes to the hospital/surgery center.   FAILURE TO FOLLOW THESE INSTRUCTIONS MAY RESULT IN THE CANCELLATION OF YOUR SURGERY  PATIENT SIGNATURE_________________________________  NURSE SIGNATURE__________________________________  ________________________________________________________________________

## 2024-04-30 NOTE — Progress Notes (Signed)
 error

## 2024-04-30 NOTE — Progress Notes (Signed)
 COVID Vaccine received:  []  No [x]  Yes Date of any COVID positive Test in last 90 days: None  PCP - Glendia Fielding, MD Cardiologist - none  Chest x-ray -  EKG -  09-2022  repeated 05-01-24 Stress Test -  ECHO -  Cardiac Cath -  CT Coronary Calcium score:   Bowel Prep - [x]  No  []   Yes ______  Pacemaker / ICD device [x]  No []  Yes   Spinal Cord Stimulator:[x]  No []  Yes       History of Sleep Apnea? []  No [x]  Yes   CPAP used?- []  No [x]  Yes    Patient has: []  NO Hx DM   [x]  Pre-DM   []  DM1  []   DM2 Does the patient monitor blood sugar?   []  N/A   [x]  No []  Yes  Last A1c was: 5.4  on  02-29-2024     ZEPBOUND  - Holding as of 05-01-24  Blood Thinner / Instructions: none Aspirin Instructions:  none  Dental hx: []  Dentures:  []  N/A      []  Bridge or Partial: has Permanent retainer  Activity level: Able to walk up 2 flights of stairs without becoming significantly short of breath or having chest pain?  [x]  No  SOB  []    Yes  Patient can perform ADLs without assistance. []  No   [x]   Yes  Anesthesia review: HTN, Pre-DM, GERD, Palps, OSA-CPAP, anxiety/ depression  Patient denies any S&S of respiratory illness or Covid - no shortness of breath, fever, cough or chest pain at PAT appointment.  Patient verbalized understanding and agreement to the Pre-Surgical Instructions that were given to them at this PAT appointment. Patient was also educated of the need to review these PAT instructions again prior toher surgery.I reviewed the appropriate phone numbers to call if they have any and questions or concerns.

## 2024-05-01 ENCOUNTER — Encounter (HOSPITAL_COMMUNITY): Payer: Self-pay

## 2024-05-01 ENCOUNTER — Encounter (HOSPITAL_COMMUNITY)
Admission: RE | Admit: 2024-05-01 | Discharge: 2024-05-01 | Disposition: A | Discharge status: Home / Self Care | Source: Ambulatory Visit | Attending: Urology | Admitting: Urology

## 2024-05-01 ENCOUNTER — Other Ambulatory Visit: Payer: Self-pay

## 2024-05-01 VITALS — BP 121/68 | Temp 98.3°F | Resp 16 | Ht 64.0 in | Wt 192.0 lb

## 2024-05-01 DIAGNOSIS — R7989 Other specified abnormal findings of blood chemistry: Secondary | ICD-10-CM | POA: Diagnosis not present

## 2024-05-01 DIAGNOSIS — Z01818 Encounter for other preprocedural examination: Secondary | ICD-10-CM | POA: Insufficient documentation

## 2024-05-01 DIAGNOSIS — I10 Essential (primary) hypertension: Secondary | ICD-10-CM | POA: Insufficient documentation

## 2024-05-01 HISTORY — DX: Prediabetes: R73.03

## 2024-05-01 HISTORY — DX: Cardiac arrhythmia, unspecified: I49.9

## 2024-05-01 HISTORY — DX: Personal history of urinary calculi: Z87.442

## 2024-05-01 LAB — COMPREHENSIVE METABOLIC PANEL WITH GFR
ALT: 51 U/L — ABNORMAL HIGH (ref 0–44)
AST: 29 U/L (ref 15–41)
Albumin: 4.3 g/dL (ref 3.5–5.0)
Alkaline Phosphatase: 74 U/L (ref 38–126)
Anion gap: 9 (ref 5–15)
BUN: 15 mg/dL (ref 6–20)
CO2: 21 mmol/L — ABNORMAL LOW (ref 22–32)
Calcium: 9.2 mg/dL (ref 8.9–10.3)
Chloride: 108 mmol/L (ref 98–111)
Creatinine, Ser: 0.89 mg/dL (ref 0.44–1.00)
GFR, Estimated: >60 mL/min (ref >60)
Glucose, Bld: 93 mg/dL (ref 70–99)
Potassium: 4.2 mmol/L (ref 3.5–5.1)
Sodium: 138 mmol/L (ref 135–145)
Total Bilirubin: 0.6 mg/dL (ref 0.0–1.2)
Total Protein: 7.2 g/dL (ref 6.5–8.1)

## 2024-05-01 LAB — CBC
HCT: 46.7 % — ABNORMAL HIGH (ref 36.0–46.0)
Hemoglobin: 14.7 g/dL (ref 12.0–15.0)
MCH: 29.5 pg (ref 26.0–34.0)
MCHC: 31.5 g/dL (ref 30.0–36.0)
MCV: 93.6 fL (ref 80.0–100.0)
Platelets: 328 K/uL (ref 150–400)
RBC: 4.99 MIL/uL (ref 3.87–5.11)
RDW: 12.4 % (ref 11.5–15.5)
WBC: 6.6 K/uL (ref 4.0–10.5)
nRBC: 0 % (ref 0.0–0.2)

## 2024-05-05 ENCOUNTER — Ambulatory Visit: Admitting: Neurology

## 2024-05-12 ENCOUNTER — Other Ambulatory Visit: Payer: Self-pay

## 2024-05-12 ENCOUNTER — Ambulatory Visit (HOSPITAL_COMMUNITY): Payer: Self-pay | Admitting: Physician Assistant

## 2024-05-12 ENCOUNTER — Ambulatory Visit (HOSPITAL_COMMUNITY)
Admission: RE | Admit: 2024-05-12 | Discharge: 2024-05-12 | Disposition: A | Discharge status: Home / Self Care | Attending: Urology | Admitting: Urology

## 2024-05-12 ENCOUNTER — Ambulatory Visit (HOSPITAL_COMMUNITY): Admitting: Anesthesiology

## 2024-05-12 ENCOUNTER — Encounter (HOSPITAL_COMMUNITY)
Admission: RE | Disposition: A | Discharge status: Home / Self Care | Payer: Self-pay | Source: Home / Self Care | Attending: Urology

## 2024-05-12 ENCOUNTER — Encounter (HOSPITAL_COMMUNITY): Payer: Self-pay | Admitting: Urology

## 2024-05-12 DIAGNOSIS — N393 Stress incontinence (female) (male): Secondary | ICD-10-CM | POA: Insufficient documentation

## 2024-05-12 DIAGNOSIS — I10 Essential (primary) hypertension: Secondary | ICD-10-CM | POA: Insufficient documentation

## 2024-05-12 DIAGNOSIS — K219 Gastro-esophageal reflux disease without esophagitis: Secondary | ICD-10-CM | POA: Insufficient documentation

## 2024-05-12 HISTORY — PX: CYSTOSCOPY WITH INJECTION: SHX1424

## 2024-05-12 LAB — HCG, SERUM, QUALITATIVE: Preg, Serum: NEGATIVE

## 2024-05-12 SURGERY — CYSTOSCOPY, WITH INJECTION OF BLADDER NECK OR BLADDER WALL
Anesthesia: General

## 2024-05-12 MED ORDER — FENTANYL CITRATE (PF) 100 MCG/2ML IJ SOLN
INTRAMUSCULAR | Status: AC
  Filled 2024-05-12: qty 2

## 2024-05-12 MED ORDER — LACTATED RINGERS IV SOLN: INTRAVENOUS | Status: DC

## 2024-05-12 MED ORDER — FENTANYL CITRATE (PF) 50 MCG/ML IJ SOSY: 25.0000 ug | PREFILLED_SYRINGE | INTRAMUSCULAR | Status: DC | PRN

## 2024-05-12 MED ORDER — PROPOFOL 500 MG/50ML IV EMUL
INTRAVENOUS | Status: DC | PRN
  Administered 2024-05-12: 120 ug/kg/min via INTRAVENOUS

## 2024-05-12 MED ORDER — ONDANSETRON HCL 4 MG/2ML IJ SOLN
INTRAMUSCULAR | Status: AC
  Filled 2024-05-12: qty 2

## 2024-05-12 MED ORDER — WATER FOR IRRIGATION, STERILE IR SOLN
Status: DC | PRN
  Administered 2024-05-12: 3000 mL

## 2024-05-12 MED ORDER — ACETAMINOPHEN 10 MG/ML IV SOLN: 1000.0000 mg | Freq: Once | INTRAVENOUS | Status: DC | PRN

## 2024-05-12 MED ORDER — OXYCODONE HCL 5 MG PO TABS: 5.0000 mg | ORAL_TABLET | Freq: Once | ORAL | Status: DC | PRN

## 2024-05-12 MED ORDER — ONDANSETRON HCL 4 MG/2ML IJ SOLN
INTRAMUSCULAR | Status: DC | PRN
Start: 2024-05-12 — End: 2024-05-12
  Administered 2024-05-12: 4 mg via INTRAVENOUS

## 2024-05-12 MED ORDER — PROPOFOL 10 MG/ML IV BOLUS
INTRAVENOUS | Status: AC
  Filled 2024-05-12: qty 20

## 2024-05-12 MED ORDER — PROPOFOL 500 MG/50ML IV EMUL
INTRAVENOUS | Status: AC
Start: 2024-05-12 — End: 2024-05-12
  Filled 2024-05-12: qty 50

## 2024-05-12 MED ORDER — OXYCODONE HCL 5 MG/5ML PO SOLN: 5.0000 mg | Freq: Once | ORAL | Status: DC | PRN

## 2024-05-12 MED ORDER — ORAL CARE MOUTH RINSE: 15.0000 mL | Freq: Once | OROMUCOSAL | Status: AC

## 2024-05-12 MED ORDER — CEFAZOLIN SODIUM-DEXTROSE 2-4 GM/100ML-% IV SOLN
2.0000 g | INTRAVENOUS | Status: AC
  Administered 2024-05-12: 2 g via INTRAVENOUS
  Filled 2024-05-12: qty 100

## 2024-05-12 MED ORDER — CHLORHEXIDINE GLUCONATE 0.12 % MT SOLN
15.0000 mL | Freq: Once | OROMUCOSAL | Status: AC
  Administered 2024-05-12: 15 mL via OROMUCOSAL

## 2024-05-12 MED ORDER — MIDAZOLAM HCL 2 MG/2ML IJ SOLN
INTRAMUSCULAR | Status: AC
  Filled 2024-05-12: qty 2

## 2024-05-12 MED ORDER — MIDAZOLAM HCL 5 MG/5ML IJ SOLN
INTRAMUSCULAR | Status: DC | PRN
  Administered 2024-05-12: 2 mg via INTRAVENOUS

## 2024-05-12 MED ORDER — ONDANSETRON HCL 4 MG/2ML IJ SOLN: 4.0000 mg | Freq: Once | INTRAMUSCULAR | Status: DC | PRN

## 2024-05-12 MED ORDER — FENTANYL CITRATE (PF) 100 MCG/2ML IJ SOLN
INTRAMUSCULAR | Status: DC | PRN
  Administered 2024-05-12: 50 ug via INTRAVENOUS

## 2024-05-12 MED ORDER — DROPERIDOL 2.5 MG/ML IJ SOLN: 0.6250 mg | Freq: Once | INTRAMUSCULAR | Status: DC | PRN

## 2024-05-12 MED ORDER — PROPOFOL 10 MG/ML IV BOLUS
INTRAVENOUS | Status: DC | PRN
Start: 2024-05-12 — End: 2024-05-12
  Administered 2024-05-12: 20 mg via INTRAVENOUS
  Administered 2024-05-12: 10 mg via INTRAVENOUS

## 2024-05-12 SURGICAL SUPPLY — 17 items
BAG URO CATCHER STRL LF (MISCELLANEOUS) ×2 IMPLANT
CLOTH BEACON ORANGE TIMEOUT ST (SAFETY) ×2 IMPLANT
ELECT REM PT RETURN 15FT ADLT (MISCELLANEOUS) ×2 IMPLANT
GLOVE BIO SURGEON STRL SZ 6.5 (GLOVE) ×2 IMPLANT
GOWN STRL REUS W/ TWL LRG LVL3 (GOWN DISPOSABLE) ×2 IMPLANT
KIT TURNOVER KIT A (KITS) ×2 IMPLANT
MANIFOLD NEPTUNE II (INSTRUMENTS) ×2 IMPLANT
NDL ASPIRATION 22 (NEEDLE) ×2 IMPLANT
NDL SAFETY ECLIPSE 18X1.5 (NEEDLE) ×2 IMPLANT
NEEDLE ASPIRATION 22 (NEEDLE) IMPLANT
PACK CYSTO (CUSTOM PROCEDURE TRAY) ×2 IMPLANT
SYR 20ML LL LF (SYRINGE) ×2 IMPLANT
SYR CONTROL 10ML LL (SYRINGE) ×2 IMPLANT
SYSTEM URETHRAL BULK BULKAMID (Female Continence) IMPLANT
TUBING CONNECTING 10 (TUBING) ×2 IMPLANT
TUBING UROLOGY SET (TUBING) ×2 IMPLANT
WATER STERILE IRR 3000ML UROMA (IV SOLUTION) ×2 IMPLANT

## 2024-05-12 NOTE — Anesthesia Postprocedure Evaluation (Signed)
 Anesthesia Post Note  Patient: Jillian Ford  Procedure(s) Performed: CYSTOSCOPY, WITH INJECTION OF BLADDER NECK OR BLADDER WALL     Patient location during evaluation: PACU Anesthesia Type: General Level of consciousness: awake Pain management: pain level controlled Vital Signs Assessment: post-procedure vital signs reviewed and stable Respiratory status: spontaneous breathing Cardiovascular status: blood pressure returned to baseline Postop Assessment: no apparent nausea or vomiting Anesthetic complications: no   No notable events documented.  Last Vitals:  Vitals:   05/12/24 0830 05/12/24 0845  BP: 115/89 121/72  Pulse: 65 65  Resp: 13   Temp: (!) 36.4 C   SpO2: 100% 100%    Last Pain:  Vitals:   05/12/24 0845  TempSrc:   PainSc: 0-No pain                 Lauraine KATHEE [place]

## 2024-05-12 NOTE — Discharge Instructions (Signed)
Cystoscopy patient instructions  Following a cystoscopy, a catheter (a flexible rubber tube) is sometimes left in place to empty the bladder. This may cause some discomfort or a feeling that you need to urinate. Your doctor determines the period of time that the catheter will be left in place. You may have bloody urine for two to three days (Call your doctor if the amount of bleeding increases or does not subside).  You may pass blood clots in your urine, especially if you had a biopsy. It is not unusual to pass small blood clots and have some bloody urine a couple of weeks after your cystoscopy. Again, call your doctor if the bleeding does not subside. You may have: Dysuria (painful urination) Frequency (urinating often) Urgency (strong desire to urinate)  These symptoms are common especially if medicine is instilled into the bladder or a ureteral stent is placed. Avoiding alcohol and caffeine, such as coffee, tea, and chocolate, may help relieve these symptoms. Drink plenty of water, unless otherwise instructed. Your doctor may also prescribe an antibiotic or other medicine to reduce these symptoms.  Cystoscopy results are available soon after the procedure; biopsy results usually take two to four days. Your doctor will discuss the results of your exam with you. Before you go home, you will be given specific instructions for follow-up care. Special Instructions:   If you are going home with a catheter in place do not take a tub bath until removed by your doctor.   You may resume your normal activities.   Do not drive or operate machinery if you are taking narcotic pain medicine.   Be sure to keep all follow-up appointments with your doctor.   Call Your Doctor If: The catheter is not draining You have severe pain You are unable to urinate You have a fever over 101 You have severe bleeding

## 2024-05-12 NOTE — H&P (Signed)
 History of present illness: 53 yo woman with symptomatic stress urinary incontinence here for bulkamid.   Review of systems: A 12 point comprehensive review of systems was obtained and is negative unless otherwise stated in the history of present illness.  Patient Active Problem List   Diagnosis Date Noted   BMI 34.0-34.9,adult 03/19/2024   Severe obstructive sleep apnea-hypopnea syndrome 03/11/2024   Encounter for surveillance of contraceptive pills 03/09/2024   Intolerance of continuous positive airway pressure (CPAP) ventilation 01/22/2024   Aerophagia 01/22/2024   Hypersomnia with sleep apnea 01/22/2024   OAB (overactive bladder) 10/08/2023   Urge incontinence of urine 10/08/2023   Urinary urgency 10/08/2023   Mixed stress and urge urinary incontinence 10/08/2023   SUI (stress urinary incontinence, female) 09/09/2023   Decreased libido 09/09/2023   Seasonal allergies 11/08/2022   Irregular periods 09/07/2022   Hot flashes 09/07/2022   Generalized obesity with starting BMI 31.5 08/28/2022   Polyphagia 05/16/2022   Perimenopausal 04/12/2022   Amenorrhea 03/14/2022   Depression 03/14/2022   Elevated ALT measurement 03/14/2022   Vitamin D  deficiency 03/14/2022   Essential hypertension 03/14/2022   Depression with anxiety 03/18/2020   Gastroesophageal reflux disease without esophagitis 12/08/2016   Irritable bowel syndrome with both constipation and diarrhea 12/08/2016   Ganglion cyst of right foot 12/08/2016   Anxiety 08/29/2015   Insomnia 08/29/2015    No current facility-administered medications on file prior to encounter.   Current Outpatient Medications on File Prior to Encounter  Medication Sig Dispense Refill   acetaminophen (TYLENOL) 325 MG tablet Take 650 mg by mouth every 6 (six) hours as needed for moderate pain (pain score 4-6).     azelastine  (ASTELIN ) 0.1 % nasal spray Place 2 sprays into both nostrils 2 (two) times daily. (Patient taking differently:  Place 2 sprays into both nostrils daily as needed for allergies or rhinitis.) 30 mL 12   calcium carbonate (TUMS - DOSED IN MG ELEMENTAL CALCIUM) 500 MG chewable tablet Chew 1 tablet by mouth daily as needed for indigestion or heartburn.     Drospirenone-Estetrol (NEXTSTELLIS ) 3-14.2 MG TABS Take 1 tablet by mouth daily.     escitalopram  (LEXAPRO ) 20 MG tablet Take 1 tablet (20 mg total) by mouth at bedtime. 90 tablet 1   ibuprofen  (ADVIL ) 200 MG tablet Take 400 mg by mouth every 6 (six) hours as needed for mild pain (pain score 1-3).     losartan -hydrochlorothiazide (HYZAAR) 50-12.5 MG tablet Take 1 tablet by mouth daily. 90 tablet 1   meclizine  (ANTIVERT ) 12.5 MG tablet Take 1 tablet (12.5 mg total) by mouth 3 (three) times daily as needed for dizziness. Do not take with alcohol or while driving or operating heavy machinery.  May cause drowsiness. 30 tablet 0   polyethylene glycol (MIRALAX ) 17 g packet Take 17 g twice daily until constipation improves. (Patient taking differently: Take 17 g by mouth 2 (two) times daily as needed for moderate constipation. Take 17 g twice daily until constipation improves.) 30 each 0   senna-docusate (SENOKOT-S) 8.6-50 MG tablet Take 2 tablets by mouth at bedtime as needed for mild constipation. 60 tablet 0   tirzepatide  (ZEPBOUND ) 2.5 MG/0.5ML injection vial Inject 2.5 mg into the skin once a week. 2 mL 0   triamcinolone  cream (KENALOG ) 0.1 % Apply 1 Application topically 2 (two) times daily. (Patient taking differently: Apply 1 Application topically 2 (two) times daily as needed (irritation).) 30 g 0   valACYclovir  (VALTREX ) 1000 MG tablet TAKE  2 TABLETS BY MOUTH AT THE FIRST SIGN OF COLD SORE AND REPEAT IN 12 HOURSUSE AS NEEDED 12 tablet 5   promethazine -dextromethorphan (PROMETHAZINE -DM) 6.25-15 MG/5ML syrup Take 5 mLs by mouth 4 (four) times daily as needed. (Patient not taking: Reported on 04/28/2024) 100 mL 0   tirzepatide  5 MG/0.5ML injection vial Inject 5 mg  into the skin once a week. 2 mL 0   tirzepatide  7.5 MG/0.5ML injection vial Inject 7.5 mg into the skin once a week. 2 mL 0    Past Medical History:  Diagnosis Date   Anxiety    Back pain    Depression    Dysrhythmia    palps   Edema of both lower extremities    GERD (gastroesophageal reflux disease)    History of kidney stones    Hypertension    IBS (irritable bowel syndrome)    Palpitations    Pre-diabetes    Sleep apnea    SOB (shortness of breath)     Past Surgical History:  Procedure Laterality Date   NO PAST SURGERIES      Social History   Tobacco Use   Smoking status: Never   Smokeless tobacco: Never  Vaping Use   Vaping status: Never Used  Substance Use Topics   Alcohol use: Yes    Comment: social   Drug use: Never    Family History  Problem Relation Age of Onset   Hypertension Mother    Other Mother        brain injury   Alcohol abuse Father    Stroke Father    Early death Father    Hypertension Father    Sleep apnea Father    Dementia Maternal Grandmother    Heart attack Paternal Grandmother     PE: Vitals:   05/12/24 0525 05/12/24 0550  BP: 136/81   Pulse: 82   Resp: 17   Temp: 98.7 F (37.1 C)   TempSrc: Oral   SpO2: 96%   Weight:  87.1 kg  Height:  5' 4 (1.626 m)   Patient appears to be in no acute distress  patient is alert and oriented x3 Atraumatic normocephalic head No increased work of breathing, no audible wheezes/rhonchi Lower extremities are symmetric without appreciable edema Grossly neurologically intact No identifiable skin lesions  No results for input(s): WBC, HGB, HCT in the last 72 hours. No results for input(s): NA, K, CL, CO2, GLUCOSE, BUN, CREATININE, CALCIUM in the last 72 hours. No results for input(s): LABPT, INR in the last 72 hours. No results for input(s): LABURIN in the last 72 hours. Results for orders placed or performed in visit on 10/08/23  Microscopic Examination      Status: Abnormal   Collection Time: 10/08/23  8:22 AM   Urine  Result Value Ref Range Status   WBC, UA 0-5 0 - 5 /hpf Final   RBC, Urine 0-2 0 - 2 /hpf Final   Epithelial Cells (non renal) 0-10 0 - 10 /hpf Final   Mucus, UA Present (A) Not Estab. Final   Bacteria, UA None seen None seen/Few Final     Assessment/Plan: Stress urinary incontinence: -risks and benefits have previously been discussed and informed consent obtained (see mod med note). We reviewed possibility of postop urinary retention -bulkamid under MAC today   Miranda Garber D Melisse Caetano

## 2024-05-12 NOTE — Anesthesia Preprocedure Evaluation (Addendum)
 Anesthesia Evaluation  Patient identified by MRN, date of birth, ID band Patient awake    Reviewed: Allergy & Precautions, NPO status , Patient's Chart, lab work & pertinent test results  History of Anesthesia Complications Negative for: history of anesthetic complications  Airway Mallampati: II  TM Distance: >3 FB Neck ROM: Full    Dental  (+) Teeth Intact, Dental Advisory Given   Pulmonary    breath sounds clear to auscultation       Cardiovascular hypertension,  Rhythm:Regular Rate:Normal     Neuro/Psych    GI/Hepatic ,GERD  ,,  Endo/Other    Renal/GU      Musculoskeletal   Abdominal   Peds  Hematology   Anesthesia Other Findings   Reproductive/Obstetrics                              Anesthesia Physical Anesthesia Plan  ASA: 2  Anesthesia Plan: General   Post-op Pain Management:    Induction: Intravenous  PONV Risk Score and Plan: 2 and Ondansetron , Treatment may vary due to age or medical condition and Dexamethasone   Airway Management Planned: LMA  Additional Equipment:   Intra-op Plan:   Post-operative Plan: Extubation in OR  Informed Consent:      Dental advisory given  Plan Discussed with: CRNA and Surgeon  Anesthesia Plan Comments:          Anesthesia Quick Evaluation

## 2024-05-12 NOTE — Op Note (Signed)
 Operative Note   Preoperative diagnosis:  1.  Stress urinary incontinence   Postoperative diagnosis: 1.  Stress urinary incontinence   Procedure(s): 1.  Cystoscopy with injection of bulkamid   Surgeon: Valli Shank, MD   Assistants:  None   Anesthesia:  General   Complications:  None   EBL:  minimal   Specimens: 1. none   Drains/Catheters: 1.  none   Intraoperative findings:   Normal urethra Bilateral orthotopic Uos Normal bladder mucosa   Indication:  53 yo woman with symptomatic stress urinary incontinence.   Description of procedure:   After risks and benefits of the procedure discussed with the patient, informed consent was obtained.  The patient was taken to the operating placed in the supine position.  Anesthesia was induced and antibiotics were administered.  The patient was then repositioned in the dorsolithotomy position.  She was prepped and draped in usual sterile fashion a timeout performed with the attending present.  The cystoscope was assembled with the Bulkamid system.  It was then placed in the urethral meatus and advanced into the bladder under direct visualization.  Prior cystoscopy had been done which noted normal anatomic landmarks.  These were again verified during cystoscopy today.  The cystoscope was brought back to the bladder neck and the needle was advanced through the needle guide at the 1 o'clock position.  Once it was visualized and advanced it was rotated to the 5 o'clock position.  Bulkamid was then injected until bleb was seen.  This was then repeated at the 1 o'clock position in the 7 o'clock position until coaptation was noted.   This concluded the case.  The patient's bladder was left with approximately 200 cc of sterile saline.  The patient emerged from anesthesia and was transferred the PACU in stable condition.   Plan:  Plan for patient to void in PACU prior to discharge.

## 2024-05-12 NOTE — Transfer of Care (Signed)
 Immediate Anesthesia Transfer of Care Note  Patient: Jillian Ford  Procedure(s) Performed: CYSTOSCOPY, WITH INJECTION OF BLADDER NECK OR BLADDER WALL  Patient Location: PACU  Anesthesia Type:MAC  Level of Consciousness: awake, alert , and oriented  Airway & Oxygen Therapy: Patient Spontanous Breathing and Patient connected to face mask oxygen  Post-op Assessment: Report given to RN and Post -op Vital signs reviewed and stable  Post vital signs: Reviewed and stable  Last Vitals:  Vitals Value Taken Time  BP 109/63 05/12/24 07:57  Temp    Pulse 83 05/12/24 07:58  Resp 23 05/12/24 07:58  SpO2 99 % 05/12/24 07:58  Vitals shown include unfiled device data.  Last Pain:  Vitals:   05/12/24 0550  TempSrc:   PainSc: 0-No pain         Complications: No notable events documented.

## 2024-05-13 ENCOUNTER — Ambulatory Visit: Admitting: Family Medicine

## 2024-05-13 ENCOUNTER — Encounter: Payer: Self-pay | Admitting: Family Medicine

## 2024-05-13 VITALS — BP 105/71 | HR 83 | Temp 98.0°F | Ht 64.0 in | Wt 196.0 lb

## 2024-05-13 DIAGNOSIS — Z23 Encounter for immunization: Secondary | ICD-10-CM

## 2024-05-13 DIAGNOSIS — I10 Essential (primary) hypertension: Secondary | ICD-10-CM | POA: Diagnosis not present

## 2024-05-13 DIAGNOSIS — G4733 Obstructive sleep apnea (adult) (pediatric): Secondary | ICD-10-CM

## 2024-05-13 DIAGNOSIS — Z6834 Body mass index (BMI) 34.0-34.9, adult: Secondary | ICD-10-CM

## 2024-05-13 DIAGNOSIS — Z13220 Encounter for screening for lipoid disorders: Secondary | ICD-10-CM

## 2024-05-13 MED ORDER — LOSARTAN POTASSIUM 50 MG PO TABS: 50.0000 mg | ORAL_TABLET | Freq: Every day | ORAL | 2 refills | Status: AC

## 2024-05-13 NOTE — Progress Notes (Signed)
   Subjective:    Patient ID: Jillian Ford, female    DOB: Jan 23, 1971, 53 y.o.   MRN: [id number]  HPI  6 month follow up - had a bladder surgery yesterday - urethra Discussed the use of AI scribe software for clinical note transcription with the patient, who gave verbal consent to proceed.  History of Present Illness   JANYE Ford is a 53 year old female who presents with hip pain and follow-up on Zepbound  treatment.  She experiences significant hip pain, primarily at night when using her CPAP machine. The pain worsens after sitting for a while and then standing up, as well as during prolonged walking. Aleve provides some relief for her pain.  She recently started using a CPAP machine for sleep apnea, which she feels restricts her movement during sleep, potentially contributing to her hip discomfort. Initially, she used a full-face mask for her CPAP machine.  She has been on Zepbound , having completed four doses of 2.5 mg before pausing for surgery. She experienced constipation as a side effect. She plans to resume with a 5 mg dose and prefers to take it on weekends to manage potential side effects without impacting her work. Her insurance covers the medication at a reasonable cost.  She is currently on losartan  HCTZ for blood pressure management. Her blood pressure readings have been on the lower end of normal, and she is monitoring her hydration levels to ensure adequate fluid intake.      Review of Systems     Objective:   Physical Exam General-in no acute distress Eyes-no discharge Lungs-respiratory rate normal, CTA CV-no murmurs,RRR Extremities skin warm dry no edema Neuro grossly normal Behavior normal, alert        Assessment & Plan:  1. Severe obstructive sleep apnea-hypopnea syndrome (Primary) Continue CPAP If she gets dramatic weight loss with Zepbound  potentially she could stop the CPAP  2. Essential hypertension Decreased blood pressure medicine eliminate  HCTZ continue the losartan  50 mg - Basic Metabolic Panel (BMET) - Urine Microalbumin w/creat. ratio  3. Needs flu shot Flu shot today  4. BMI 34.0-34.9,adult Zepbound  Rationale discussed Patient to send us  updates regarding her progress Send us  update in 6 to 8 weeks follow-up office visit 3 months We did have discussion regarding how to taper up on the medicine and when to stay at the current dosing 5. Screening, lipid Screening - Lipid Profile  6. Immunization due Today - Flu vaccine trivalent PF, 6mos and older(Flulaval,Afluria,Fluarix,Fluzone)

## 2024-05-19 ENCOUNTER — Encounter: Payer: Self-pay | Admitting: Family Medicine

## 2024-05-28 ENCOUNTER — Ambulatory Visit (HOSPITAL_COMMUNITY)
Admission: RE | Admit: 2024-05-28 | Discharge: 2024-05-28 | Disposition: A | Discharge status: Home / Self Care | Source: Ambulatory Visit | Attending: Adult Health | Admitting: Adult Health

## 2024-05-28 ENCOUNTER — Encounter (HOSPITAL_COMMUNITY): Payer: Self-pay

## 2024-05-28 ENCOUNTER — Encounter (INDEPENDENT_AMBULATORY_CARE_PROVIDER_SITE_OTHER): Payer: Self-pay

## 2024-05-28 DIAGNOSIS — Z1231 Encounter for screening mammogram for malignant neoplasm of breast: Secondary | ICD-10-CM | POA: Diagnosis present

## 2024-06-08 ENCOUNTER — Encounter: Payer: Self-pay | Admitting: Neurology

## 2024-06-08 ENCOUNTER — Encounter: Payer: Self-pay | Admitting: Family Medicine

## 2024-06-08 ENCOUNTER — Other Ambulatory Visit: Payer: Self-pay | Admitting: Family Medicine

## 2024-06-08 DIAGNOSIS — Z789 Other specified health status: Secondary | ICD-10-CM

## 2024-06-08 DIAGNOSIS — G4733 Obstructive sleep apnea (adult) (pediatric): Secondary | ICD-10-CM

## 2024-06-08 DIAGNOSIS — F458 Other somatoform disorders: Secondary | ICD-10-CM

## 2024-06-08 MED ORDER — TIRZEPATIDE-WEIGHT MANAGEMENT 5 MG/0.5ML ~~LOC~~ SOLN: 5.0000 mg | SUBCUTANEOUS | 2 refills | Status: DC

## 2024-06-08 NOTE — Telephone Encounter (Signed)
 Jillian Ford

## 2024-06-10 NOTE — Progress Notes (Signed)
 SET UP 04/20/2024  CPAP order 03/11/2024- adapt

## 2024-06-15 ENCOUNTER — Encounter: Payer: Self-pay | Admitting: Neurology

## 2024-06-15 ENCOUNTER — Ambulatory Visit: Admitting: Neurology

## 2024-06-15 VITALS — BP 122/82 | HR 98 | Ht 64.0 in | Wt 182.0 lb

## 2024-06-15 DIAGNOSIS — G4710 Hypersomnia, unspecified: Secondary | ICD-10-CM

## 2024-06-15 DIAGNOSIS — G4733 Obstructive sleep apnea (adult) (pediatric): Secondary | ICD-10-CM

## 2024-06-15 DIAGNOSIS — F458 Other somatoform disorders: Secondary | ICD-10-CM

## 2024-06-15 DIAGNOSIS — G4730 Sleep apnea, unspecified: Secondary | ICD-10-CM | POA: Diagnosis not present

## 2024-06-15 DIAGNOSIS — R0683 Snoring: Secondary | ICD-10-CM | POA: Diagnosis not present

## 2024-06-15 MED ORDER — OMEPRAZOLE MAGNESIUM 20 MG PO TBEC: 20.0000 mg | DELAYED_RELEASE_TABLET | Freq: Every evening | ORAL | 0 refills | Status: DC

## 2024-06-15 NOTE — Patient Instructions (Addendum)
   ASSESSMENT AND PLAN :   53 y.o. year old female  here with: struggling with CPAP therapy due to aerophagia but had remarkable response when titrated in the lab, REM rebounding, baseline AHI was severe ( AHI over 80/h ) . Hypoxia was resolved under CPAP     1) high air leak and aerophagia on FFM, but loud snoring . FFM is not fitting well at medium size.   Airtouch FFM in small, please.   Increase head of bed by 10-15 %  Start prilosec OTC nightly  I will increase the EPR to 3 cm water  .  RV with NP in 6-8 months, than yearly.  Weight loss is going well. If BMI is under 30 , will retest by HST.  If continue to produce aerophagia, please use chin strap and change  nasal cushions.

## 2024-06-15 NOTE — Progress Notes (Signed)
 Provider:  Dedra Gores, MD  Primary Care Physician:  Alphonsa Glendia LABOR, MD [address] [place] [address]     Referring Provider: Alphonsa Glendia LABOR, Md [address] [place],  [place] [address]          Chief Complaint according to patient   Patient presents with:                HISTORY OF PRESENT ILLNESS:  Jillian Ford is a 53 y.o. female patient who is here for revisit 06/15/2024 for  forst new CPAP compliance visit. The patient had strugle with aerophagia, belchhing and oted that this wouldn't occur on days she hadn't used CPAP. She was fitted with a FFM after her in lab study confirmed loud snoring, she has been switched to a vitera FFM.  Her compliance is still not great. She has been needing a 95% pressure of 14 cm water  and her machine is set to up to 15 cm water , 2 cm EPR.  Still belching and feeing bloated.  Uses an S11 ResMed auto machine , was just set up in October . Used an F30 I in small -  and switched to FFM ResMed F 20 AIRTOUCH with memory foam but had only one night to try it out.   She slept best at 12 and 13 cm water  in the lab.    Has also been treated with Zepbound  and reduced her BMI from 34.6 to 31.2 ,   History:  This 53 year-old Female had used CPAP since 2016, TOC visit via Dr Alphonsa in June 2025, 01-07-2024 HST ordered: AHI was 84.4/h,  O2 nadir was 69 %  and total hypoxia time was 156 minutes (!) , snoring at 48 dB mean volume, here for attended titration with optional oxygen addition.    At the time of the sleep PSG study the Epworth Sleepiness Scale was endorsed at 13 out of 24 (scores above or equal to 10 are suggestive of hypersomnolence). FSS at 54/63 points. BMI 34.7, 16  neck,     .    Review of Systems: Out of a complete 14 system review, the patient complains of only the following symptoms, and all other reviewed systems are negative.:   SLEEPINESS ?  How likely are you to doze in the following  situations: 0 = not likely, 1 = slight chance, 2 = moderate chance, 3 = high chance  Sitting and Reading? Watching Television? Sitting inactive in a public place (theater or meeting)? Lying down in the afternoon when circumstances permit? Sitting and talking to someone? Sitting quietly after lunch without alcohol? In a car, while stopped for a few minutes in traffic? As a passenger in a car for an hour without a break?  Total =  7 from 13 before sleep retesting.   FSS at 49 /63 .   Prior to  PSG : Height: 63.0 in Weight: 196 lb (BMI 34) Neck Size: 16.0 in         Social History   Socioeconomic History   Marital status: Married    Spouse name: Not on file   Number of children: Not on file   Years of education: Not on file   Highest education level: Some college, no degree  Occupational History   Not on file  Tobacco Use   Smoking status: Never   Smokeless tobacco: Never  Vaping Use   Vaping status:  Never Used  Substance and Sexual Activity   Alcohol use: Yes    Comment: social   Drug use: Never   Sexual activity: Yes    Birth control/protection: Pill  Other Topics Concern   Not on file  Social History Narrative   Not on file   Social Drivers of Health   Financial Resource Strain: Low Risk  (09/09/2023)   Overall Financial Resource Strain (CARDIA)    Difficulty of Paying Living Expenses: Not hard at all  Food Insecurity: No Food Insecurity (09/09/2023)   Hunger Vital Sign    Worried About Running Out of Food in the Last Year: Never true    Ran Out of Food in the Last Year: Never true  Transportation Needs: No Transportation Needs (09/09/2023)   PRAPARE - Administrator, Civil Service (Medical): No    Lack of Transportation (Non-Medical): No  Physical Activity: Inactive (09/09/2023)   Exercise Vital Sign    Days of Exercise per Week: 0 days    Minutes of Exercise per Session: 0 min  Stress: Stress Concern Present (09/09/2023)   [name]  of Occupational Health - Occupational Stress Questionnaire    Feeling of Stress : To some extent  Social Connections: Socially Integrated (09/09/2023)   Social Connection and Isolation Panel    Frequency of Communication with Friends and Family: Twice a week    Frequency of Social Gatherings with Friends and Family: Once a week    Attends Religious Services: 1 to 4 times per year    Active Member of [employer] or Organizations: Patient declined    Attends Engineer, Structural: 1 to 4 times per year    Marital Status: Married    Family History  Problem Relation Age of Onset   Hypertension Mother    Other Mother        brain injury   Alcohol abuse Father    Stroke Father    Early death Father    Hypertension Father    Sleep apnea Father    Dementia Maternal Grandmother    Heart attack Paternal Grandmother     Past Medical History:  Diagnosis Date   Anxiety    Back pain    Depression    Dysrhythmia    palps   Edema of both lower extremities    GERD (gastroesophageal reflux disease)    History of kidney stones    Hypertension    IBS (irritable bowel syndrome)    Palpitations    Pre-diabetes    Sleep apnea    SOB (shortness of breath)     Past Surgical History:  Procedure Laterality Date   CYSTOSCOPY WITH INJECTION N/A 05/12/2024   Procedure: CYSTOSCOPY, WITH INJECTION OF BLADDER NECK OR BLADDER WALL;  Surgeon: Elisabeth Valli BIRCH, MD;  Location: [facility];  Service: Urology;  Laterality: N/A;  CYSTOSCOPY WITH BULKAMID   NO PAST SURGERIES       Current Outpatient Medications on File Prior to Visit  Medication Sig Dispense Refill   acetaminophen  (TYLENOL ) 325 MG tablet Take 650 mg by mouth every 6 (six) hours as needed for moderate pain (pain score 4-6).     azelastine  (ASTELIN ) 0.1 % nasal spray Place 2 sprays into both nostrils 2 (two) times daily. (Patient taking differently: Place 2 sprays into both nostrils daily as needed for allergies or rhinitis.) 30 mL 12    calcium carbonate (TUMS - DOSED IN MG ELEMENTAL CALCIUM) 500 MG chewable tablet Chew 1  tablet by mouth daily as needed for indigestion or heartburn.     Drospirenone-Estetrol (NEXTSTELLIS ) 3-14.2 MG TABS Take 1 tablet by mouth daily.     escitalopram  (LEXAPRO ) 20 MG tablet Take 1 tablet (20 mg total) by mouth at bedtime. 90 tablet 1   ibuprofen  (ADVIL ) 200 MG tablet Take 400 mg by mouth every 6 (six) hours as needed for mild pain (pain score 1-3).     losartan  (COZAAR ) 50 MG tablet Take 1 tablet (50 mg total) by mouth daily. 90 tablet 2   meclizine  (ANTIVERT ) 12.5 MG tablet Take 1 tablet (12.5 mg total) by mouth 3 (three) times daily as needed for dizziness. Do not take with alcohol or while driving or operating heavy machinery.  May cause drowsiness. 30 tablet 0   polyethylene glycol (MIRALAX ) 17 g packet Take 17 g twice daily until constipation improves. (Patient taking differently: Take 17 g by mouth 2 (two) times daily as needed for moderate constipation. Take 17 g twice daily until constipation improves.) 30 each 0   tirzepatide  5 MG/0.5ML injection vial Inject 5 mg into the skin once a week. 2 mL 2   triamcinolone  cream (KENALOG ) 0.1 % Apply 1 Application topically 2 (two) times daily. (Patient taking differently: Apply 1 Application topically 2 (two) times daily as needed (irritation).) 30 g 0   valACYclovir  (VALTREX ) 1000 MG tablet TAKE 2 TABLETS BY MOUTH AT THE FIRST SIGN OF COLD SORE AND REPEAT IN 12 HOURSUSE AS NEEDED 12 tablet 5   No current facility-administered medications on file prior to visit.    Allergies  Allergen Reactions   Wellbutrin  [Bupropion ] Itching and Rash     DIAGNOSTIC DATA (LABS, IMAGING, TESTING) - I reviewed patient records, labs, notes, testing and imaging myself where available.  Lab Results  Component Value Date   WBC 6.6 05/01/2024   HGB 14.7 05/01/2024   HCT 46.7 (H) 05/01/2024   MCV 93.6 05/01/2024   PLT 328 05/01/2024      Component Value  Date/Time   NA 138 05/01/2024 0830   NA 141 10/30/2023 0822   K 4.2 05/01/2024 0830   CL 108 05/01/2024 0830   CO2 21 (L) 05/01/2024 0830   GLUCOSE 93 05/01/2024 0830   BUN 15 05/01/2024 0830   BUN 13 10/30/2023 0822   CREATININE 0.89 05/01/2024 0830   CREATININE 0.68 08/11/2014 1032   CALCIUM 9.2 05/01/2024 0830   PROT 7.2 05/01/2024 0830   PROT 7.1 11/08/2022 1513   ALBUMIN 4.3 05/01/2024 0830   ALBUMIN 4.3 11/08/2022 1513   AST 29 05/01/2024 0830   ALT 51 (H) 05/01/2024 0830   ALKPHOS 74 05/01/2024 0830   BILITOT 0.6 05/01/2024 0830   BILITOT 0.4 11/08/2022 1513   GFRNONAA >60 05/01/2024 0830   GFRAA 105 06/13/2020 0810   Lab Results  Component Value Date   CHOL 116 10/30/2023   HDL 74 10/30/2023   LDLCALC 31 10/30/2023   TRIG 42 10/30/2023   CHOLHDL 1.6 10/30/2023   Lab Results  Component Value Date   HGBA1C 5.4 02/28/2022   Lab Results  Component Value Date   VITAMINB12 516 02/28/2022   Lab Results  Component Value Date   TSH 1.450 11/05/2023    PHYSICAL EXAM:  Vitals:   06/15/24 0816  BP: 122/82  Pulse: 98   No data found. Body mass index is 31.24 kg/m.   Wt Readings from Last 3 Encounters:  06/15/24 182 lb (82.6 kg)  05/13/24 196 lb (88.9  kg)  05/12/24 192 lb (87.1 kg)     Ht Readings from Last 3 Encounters:  06/15/24 5' 4 (1.626 m)  05/13/24 5' 4 (1.626 m)  05/12/24 5' 4 (1.626 m)      General: The patient is awake, alert and appears not in acute distress and groomed. Head: Normocephalic, atraumatic.  Neck is supple. Mallampati 3,  neck circumference:15.5  inches  ( from 16) .   Nasal airflow is patent.   Overbite Dwan is  seen.  Dental status:  Cardiovascular:  Regular rate and cardiac rhythm by pulse, without distended neck veins. Respiratory: no shortness of breath  Skin:  Without evidence of ankle edema, or rash. Trunk: BMI is 31. 2    NEUROLOGIC EXAM: The patient is awake and alert, oriented to place and  time.   Memory subjective described as intact.  Attention span & concentration ability appears normal.   Speech is fluent,  without  dysarthria, dysphonia or aphasia.  Mood and affect are appropriate.   Neurological Examination: Mental Status: Intact. Language and speech are normal. No cognitive deficits. Cranial Nerves II-XII: Intact. PERL. EOMI. VFF.  No nystagmus. No facial droop.  No ptosis.  Hearing is grossly intact bilaterally.  The tongue is normal and midline. Motor: Strengths are 5/5 throughout. Muscle bulk and tone are normal. No tremors.  Coordination: deferred. Sensory: deferred Reflexes: Normal and symmetric throughout.  Gait and Station: Normal.    ASSESSMENT AND PLAN :   53 y.o. year old female  here with: struggling with CPAP due to aerophagia but had remarkable response when titrated in the lab, REM rebounding, baseline AHI was severe ( AHI over 80/h ) . Hypoxia was resolved under CPAP     1) high air leak and aerophagia on FFM, but loud snoring . FFM is not fitting well at medium size.   Airtouch FFM in small, please.   Increase head of bed by 10-15 %  Start prilosec OTC nightly  I will increase the EPR to 3 cm water  .  RV with NP in 6-8 months, than yearly.  Weight loss is going well. If BMI is under 30 , will retest by HST.  She may become a patient for a dental device 9 se overbite, crowded lower jaw, which is also my concern when using FFM.   If inspite of all implemented changes this patient continues to experience aerophagia, please use chin strap and change  nasal cushions.      I would like to thank Alphonsa Glendia LABOR, MD and Alphonsa Glendia LABOR, Md [address] [place],  [place] [address] for allowing me to meet with this pleasant patient.   Sleep Clinic Patients are generally offered input on sleep hygiene, life style changes and how to improve compliance with medical treatment where applicable. Review and reiteration of good sleep hygiene  measures is offered to any sleep clinic patient, be it in the first consultation or with any follow up visits.    Any patient with sleepiness should be cautioned not to drive, work at heights, or operate dangerous or heavy equipment when feeling tired or sleepy.      The patient will be seen in follow-up in the sleep clinic at [facility] for discussion of test results, sleep related symptoms and treatment compliance review, further management strategies, etc.   The referring provider will be notified of the test results.   The patient's condition requires frequent monitoring and adjustments in the treatment plan, reflecting  the ongoing complexity of care.  This provider is the continuing focal point for all needed services for this condition.  After spending a total time of  35  minutes face to face and time for  history taking, physical and neurologic examination, review of laboratory studies,  personal review of imaging studies, reports and results of other testing and review of referral information / records as far as provided in visit,   Electronically signed by: Dedra Gores, MD 06/15/2024 8:39 AM  [facility] Board certified by The [employer] of Sleep Medicine and Diplomate of the [employer] of Sleep Medicine. Board certified In Neurology through the ABPN, Fellow of the [employer] of Neurology.

## 2024-07-22 ENCOUNTER — Ambulatory Visit: Admitting: Urology

## 2024-07-23 ENCOUNTER — Ambulatory Visit: Payer: Self-pay | Admitting: Family Medicine

## 2024-07-23 ENCOUNTER — Ambulatory Visit: Admitting: Family Medicine

## 2024-07-23 VITALS — BP 114/80 | HR 86 | Temp 97.5°F | Ht 64.0 in | Wt 179.2 lb

## 2024-07-23 DIAGNOSIS — G4733 Obstructive sleep apnea (adult) (pediatric): Secondary | ICD-10-CM | POA: Diagnosis not present

## 2024-07-23 DIAGNOSIS — M7061 Trochanteric bursitis, right hip: Secondary | ICD-10-CM

## 2024-07-23 DIAGNOSIS — M7062 Trochanteric bursitis, left hip: Secondary | ICD-10-CM | POA: Diagnosis not present

## 2024-07-23 DIAGNOSIS — I10 Essential (primary) hypertension: Secondary | ICD-10-CM

## 2024-07-23 DIAGNOSIS — Z6834 Body mass index (BMI) 34.0-34.9, adult: Secondary | ICD-10-CM | POA: Diagnosis not present

## 2024-07-23 LAB — BASIC METABOLIC PANEL WITH GFR
BUN/Creatinine Ratio: 11 (ref 9–23)
BUN: 11 mg/dL (ref 6–24)
CO2: 19 mmol/L — ABNORMAL LOW (ref 20–29)
Calcium: 10 mg/dL (ref 8.7–10.2)
Chloride: 106 mmol/L (ref 96–106)
Creatinine, Ser: 0.96 mg/dL (ref 0.57–1.00)
Glucose: 104 mg/dL — ABNORMAL HIGH (ref 70–99)
Potassium: 4.7 mmol/L (ref 3.5–5.2)
Sodium: 142 mmol/L (ref 134–144)
eGFR: 71 mL/min/1.73

## 2024-07-23 LAB — LIPID PANEL
Chol/HDL Ratio: 1.8 ratio (ref 0.0–4.4)
Cholesterol, Total: 107 mg/dL (ref 100–199)
HDL: 59 mg/dL
LDL Chol Calc (NIH): 37 mg/dL (ref 0–99)
Triglycerides: 43 mg/dL (ref 0–149)
VLDL Cholesterol Cal: 11 mg/dL (ref 5–40)

## 2024-07-23 LAB — MICROALBUMIN / CREATININE URINE RATIO
Creatinine, Urine: 540.9 mg/dL
Microalb/Creat Ratio: 13 mg/g{creat} (ref 0–29)
Microalbumin, Urine: 68 ug/mL

## 2024-07-23 MED ORDER — TIRZEPATIDE-WEIGHT MANAGEMENT 5 MG/0.5ML ~~LOC~~ SOLN: 5.0000 mg | SUBCUTANEOUS | 1 refills | Status: AC

## 2024-07-23 NOTE — Progress Notes (Signed)
" ° °  Subjective:    Patient ID: Jillian Ford, female    DOB: 02-01-1971, 54 y.o.   MRN: [id number]  HPI Patient is in room 10  Patient stated last visit she discuss having hip pain. May needing an X-ray  She relates the pain is on the lateral aspect of both hips Seems to bother her when she is laying in bed To some degree it bothers her when she is standing and moving around She denies any other particular troubles When she is walking she does not have anterior hip pain No sciatica No back pain   Patient also stated having a upper elbow/arm pain when doing certain objects. Started while on vacation around Dec 15.  Patient stated it does seem to be getting better   Patient does have significant obesity with sleep apnea she has lost over 20 pounds with Zepbound  Tolerating it well 5 mg a day she does not want to go up on the dose currently because of the nausea Results for orders placed or performed in visit on 05/13/24  Basic Metabolic Panel (BMET)   Collection Time: 07/21/24  8:14 AM  Result Value Ref Range   Glucose 104 (H) 70 - 99 mg/dL   BUN 11 6 - 24 mg/dL   Creatinine, Ser 9.03 0.57 - 1.00 mg/dL   eGFR 71 >40 fO/fpw/8.26   BUN/Creatinine Ratio 11 9 - 23   Sodium 142 134 - 144 mmol/L   Potassium 4.7 3.5 - 5.2 mmol/L   Chloride 106 96 - 106 mmol/L   CO2 19 (L) 20 - 29 mmol/L   Calcium 10.0 8.7 - 10.2 mg/dL  Lipid Profile   Collection Time: 07/21/24  8:14 AM  Result Value Ref Range   Cholesterol, Total 107 100 - 199 mg/dL   Triglycerides 43 0 - 149 mg/dL   HDL 59 >60 mg/dL   VLDL Cholesterol Cal 11 5 - 40 mg/dL   LDL Chol Calc (NIH) 37 0 - 99 mg/dL   Chol/HDL Ratio 1.8 0.0 - 4.4 ratio  Urine Microalbumin w/creat. ratio   Collection Time: 07/21/24  8:14 AM  Result Value Ref Range   Creatinine, Urine 540.9 Not Estab. mg/dL   Microalbumin, Urine 31.9 Not Estab. ug/mL   Microalb/Creat Ratio 13 0 - 29 mg/g creat   Patient is taking her blood pressure medicine regular  basis try to eat healthy We did discuss her low CO2 and the importance of getting more liquid and including water    Review of Systems     Objective:   Physical Exam General-in no acute distress Eyes-no discharge Lungs-respiratory rate normal, CTA CV-no murmurs,RRR Extremities skin warm dry no edema Neuro grossly normal Behavior normal, alert Has good internal/external rotation of the hips Has tenderness in the lateral aspect of both hips Consistent with trochanter bursitis  Cholesterol profile looks very good      Assessment & Plan:   Blood pressure decent control continue current medication Patient doing a very good job losing weight She needs to drink more water  Labs reviewed with the patient Continue Zepbound  5 mg daily The use of Zepbound  currently is to help reduce her weight to help treat sleep apnea  Follow-up within 6 months Bilateral trochanter bursitis recommend stretching exercises May use Tylenol  or anti-inflammatory for short course If not seeing significant improvement next 3 to 4 weeks notify us  and we will help set up with orthopedics "

## 2024-07-25 ENCOUNTER — Other Ambulatory Visit: Payer: Self-pay | Admitting: Family Medicine

## 2024-07-25 MED ORDER — TRIAMCINOLONE ACETONIDE 0.1 % EX CREA: 1.0000 | TOPICAL_CREAM | Freq: Two times a day (BID) | CUTANEOUS | 1 refills | Status: AC | PRN

## 2024-07-29 ENCOUNTER — Other Ambulatory Visit: Payer: Self-pay | Admitting: Neurology

## 2024-07-29 ENCOUNTER — Other Ambulatory Visit: Payer: Self-pay

## 2024-07-29 MED ORDER — OMEPRAZOLE MAGNESIUM 20 MG PO TBEC: 20.0000 mg | DELAYED_RELEASE_TABLET | Freq: Every evening | ORAL | 0 refills | Status: AC

## 2024-07-29 NOTE — Progress Notes (Signed)
"   I refilled Omeprazol. CD "

## 2024-07-31 ENCOUNTER — Other Ambulatory Visit: Payer: Self-pay | Admitting: Family Medicine

## 2024-08-19 ENCOUNTER — Telehealth: Payer: Self-pay | Admitting: *Deleted

## 2024-08-19 NOTE — Telephone Encounter (Signed)
 I gave pt Nextstellis  samples, 3 boxes. Lot # [id number] exp 10/27. JSY

## 2024-08-25 ENCOUNTER — Ambulatory Visit: Admitting: Family Medicine

## 2024-09-29 ENCOUNTER — Ambulatory Visit: Admitting: Adult Health

## 2024-12-23 ENCOUNTER — Ambulatory Visit: Admitting: Adult Health

## 2025-01-22 ENCOUNTER — Ambulatory Visit: Admitting: Family Medicine
